# Patient Record
Sex: Female | Born: 1970
Health system: Southern US, Community
[De-identification: ages and names within clinical notes are randomized; demographics above are authoritative.]

## PROBLEM LIST (undated history)

## (undated) ENCOUNTER — Inpatient Hospital Stay (HOSPITAL_COMMUNITY): Payer: Self-pay

## (undated) DIAGNOSIS — Z8619 Personal history of other infectious and parasitic diseases: Secondary | ICD-10-CM

## (undated) DIAGNOSIS — R51 Headache: Secondary | ICD-10-CM

## (undated) DIAGNOSIS — O99810 Abnormal glucose complicating pregnancy: Secondary | ICD-10-CM

## (undated) DIAGNOSIS — D219 Benign neoplasm of connective and other soft tissue, unspecified: Secondary | ICD-10-CM

## (undated) DIAGNOSIS — Z6791 Unspecified blood type, Rh negative: Secondary | ICD-10-CM

## (undated) DIAGNOSIS — J302 Other seasonal allergic rhinitis: Secondary | ICD-10-CM

## (undated) DIAGNOSIS — D869 Sarcoidosis, unspecified: Secondary | ICD-10-CM

## (undated) DIAGNOSIS — I10 Essential (primary) hypertension: Secondary | ICD-10-CM

## (undated) DIAGNOSIS — O26899 Other specified pregnancy related conditions, unspecified trimester: Secondary | ICD-10-CM

## (undated) HISTORY — PX: WISDOM TOOTH EXTRACTION: SHX21

## (undated) HISTORY — PX: OTHER SURGICAL HISTORY: SHX169

## (undated) HISTORY — PX: TUBAL LIGATION: SHX77

## (undated) HISTORY — DX: Personal history of other infectious and parasitic diseases: Z86.19

---

## 2000-06-25 ENCOUNTER — Other Ambulatory Visit: Admission: RE | Admit: 2000-06-25 | Discharge: 2000-06-25 | Payer: Self-pay | Admitting: General Surgery

## 2000-06-25 ENCOUNTER — Encounter (INDEPENDENT_AMBULATORY_CARE_PROVIDER_SITE_OTHER): Payer: Self-pay

## 2001-08-12 ENCOUNTER — Other Ambulatory Visit: Admission: RE | Admit: 2001-08-12 | Discharge: 2001-08-12 | Payer: Self-pay | Admitting: Family Medicine

## 2004-01-23 ENCOUNTER — Other Ambulatory Visit: Admission: RE | Admit: 2004-01-23 | Discharge: 2004-01-23 | Payer: Self-pay | Admitting: Family Medicine

## 2004-07-11 ENCOUNTER — Inpatient Hospital Stay (HOSPITAL_COMMUNITY): Admission: AD | Admit: 2004-07-11 | Discharge: 2004-07-11 | Payer: Self-pay | Admitting: Obstetrics and Gynecology

## 2004-12-30 ENCOUNTER — Ambulatory Visit (HOSPITAL_COMMUNITY): Admission: RE | Admit: 2004-12-30 | Discharge: 2004-12-30 | Payer: Self-pay | Admitting: Obstetrics and Gynecology

## 2006-06-07 ENCOUNTER — Encounter: Admission: RE | Admit: 2006-06-07 | Discharge: 2006-06-07 | Payer: Self-pay | Admitting: Obstetrics and Gynecology

## 2006-07-27 ENCOUNTER — Inpatient Hospital Stay (HOSPITAL_COMMUNITY): Admission: AD | Admit: 2006-07-27 | Discharge: 2006-07-31 | Payer: Self-pay | Admitting: Obstetrics and Gynecology

## 2006-07-28 ENCOUNTER — Encounter (INDEPENDENT_AMBULATORY_CARE_PROVIDER_SITE_OTHER): Payer: Self-pay | Admitting: Specialist

## 2006-08-01 ENCOUNTER — Encounter: Admission: RE | Admit: 2006-08-01 | Discharge: 2006-08-31 | Payer: Self-pay | Admitting: Obstetrics and Gynecology

## 2006-09-01 ENCOUNTER — Encounter: Admission: RE | Admit: 2006-09-01 | Discharge: 2006-09-27 | Payer: Self-pay | Admitting: Obstetrics and Gynecology

## 2010-04-21 ENCOUNTER — Emergency Department (HOSPITAL_COMMUNITY)
Admission: EM | Admit: 2010-04-21 | Discharge: 2010-04-21 | Payer: Self-pay | Source: Home / Self Care | Admitting: Emergency Medicine

## 2010-10-03 NOTE — Op Note (Signed)
NAMEPATTYE, MEDA          ACCOUNT NO.:  [id number]   MEDICAL RECORD NO.:  [id number]          PATIENT TYPE:  INP   LOCATION:  9107                          FACILITY:  [facility]   PHYSICIAN:  Maxie Better, M.D.DATE OF BIRTH:  [birth year]   DATE OF PROCEDURE:  07/28/2006  DATE OF DISCHARGE:                               OPERATIVE REPORT   PREOPERATIVE DIAGNOSIS:  Nonreassuring fetal tracing, intrauterine  gestation of 40-1/7 weeks, oligohydramnios.   PROCEDURES:  Urgent primary cesarean section, Kerr hysterotomy.   POSTOPERATIVE DIAGNOSIS:  Nonreassuring fetal tracing, intrauterine  gestation at 40-1/7 weeks, oligohydramnios.   ANESTHESIA:  Spinal.   SURGEON:  Maxie Better, M.D.   ASSISTANT:  Marlinda Mike CNM.   INDICATION:  This is an 40 year old, G2, P0-0-1-0, married black female  at 40-1/7 weeks admitted for induction of labor secondary to  oligohydramnios and ultrasound for post dates.  The patient denied any  leakage of fluid.  She presented to [facility] where ultimately a  reactive nonstress test was noted.  The patient had Cervidil placement.  Subsequently, the patient was noted to have spontaneous decelerations  lasting for 2-1/2 to 3 minutes duration, down to 120s for a baseline of  140.  The Cervidil was removed.  The cervix was noted to still be  closed, the presenting part of the pelvis.  Due to the inability to the  abscess the fetal well-being, cesarean section was recommended.  The  patient was apprised of the risks, transferred to the OR for the  procedure.   PROCEDURE:  Under adequate spinal anesthesia, the patient was placed in  the supine position with a left lateral tilt.  She was sterilely prepped  and draped in the usual fashion.  Indwelling Foley catheter was  sterilely placed.  A Pfannenstiel skin incision was then made, carried  down to the rectus fascia.  Rectus fascia was opened transversely.  Rectus fascia then bluntly and  sharply dissected off the rectus muscle  in a superior and inferior fashion.  The rectus muscle was split in the  midline.  The parietal peritoneum was opened bluntly.  The vesicouterine  peritoneum was opened transversely.  The bladder was bluntly dissected  off the lower uterine segment and displaced inferiorly.  A curvilinear  low transverse uterine incision was then made and extended bilaterally  using bandage scissors.  On entering the uterine cavity, thick meconium  was noted.  Subsequent delivery of a live female, cord around the neck  which was reducible, was delivered.  Baby was bulb suctioned.  Cord was  clamped, cut.  The baby was transferred to the awaiting pediatrician who  assigned Apgars of 8 and 9 at one and five minutes.  Cord pH was  obtained which was ultimately noted to be 7.31.  The placenta was  spontaneous, intact, was noted to be bilobed with a velamentous cord  insertion that was sent to pathology.  Uterine cavity was cleaned of  debris.  Uterine incision had no extension.  Uterine incision was closed  in two layers, the first layer 0 Monocryl running locked stitch, second  layer imbricated 0 Monocryl  suture.  Good hemostasis was noted.  Abdomen  was copiously irrigated and suctioned of debris.  Normal tubes and  ovaries were noted bilaterally.  The vesicouterine peritoneum and the  parietal peritoneum were not closed.  Rectus fascia was closed with 0  Vicryl x2.  The subcutaneous areas were irrigated, small bleeders  cauterized.  Interrupted 2-0 plain sutures were placed to approximate  the subcutaneous area and the skin approximated with  Ethicon staples.  The placenta sent to pathology.  Estimated blood loss  was 800 mL.  Intraoperative fluid was 2000.  Urine output was 500 mL.  Sponge and instrument counts x2 were correct.  Complication was none.  Weight of the baby was 7 pounds.  The patient was transferred to  recovery room in stable condition.       Maxie Better, M.D.  Electronically Signed     [place]/MEDQ  D:  07/28/2006  T:  07/29/2006  Job:  [id number]

## 2010-10-03 NOTE — Discharge Summary (Signed)
Brenda Valencia, Brenda Valencia          ACCOUNT NO.:  [id number]   MEDICAL RECORD NO.:  [id number]          PATIENT TYPE:  INP   LOCATION:  9107                          [facility]ACILITY:  [facility]   PHYSICIAN:  Maxie Better, M.D.DATE OF BIRTH:  1971-02-22   DATE OF ADMISSION:  07/27/2006  DATE OF DISCHARGE:  07/31/2006                               DISCHARGE SUMMARY   ADMISSION DIAGNOSES:  1. Intrauterine gestation at 52 and 1/7 weeks.  2. Oligohydramnios.   DISCHARGE DIAGNOSES:  1. Intrauterine gestation at 80 and 1/7 weeks, delivered.  2. Nonreassuring fetal tracing.  3. Oligohydramnios.   PROCEDURE:  Primary Cesarean section Kerr hysterotomy.   HISTORY OF PRESENT ILLNESS:  40 year old g2, p0-0-1-0 female at 61 and  1/7 weeks, admitted for induction and labor secondary to  oligohydramnios.   HOSPITAL COURSE:  The patient was admitted.  Blood pressure was 143/94.  She had 1+ edema.  She had a reactive tracing.  The cervix was closed  50%.  The patient had Cervidil placement.  She subsequently had  recurrent decelerations for 2 to 2.5 minutes with the cervix remaining  closed.  Fetal heart rate was in the 160's with spontaneous  decelerations down to the 120's.  The Cervidil was removed and the  cervix was not open.  Given the ability to assess this fetus, a primary  Cesarean section was recommended for nonreassuring tracing.  The patient  was taken to the operating room.  A primary Cesarean section was  performed.  Live female, cord around the neck x1, Apgars 8 and 9, cord pH  of 7.31, normal tubes and ovaries, thick meconium and a bilobed  velamentous cord insertion of the placenta was noted.  The placenta was  sent to pathology.  The pathologist confirmed a bilobed placenta with a  marginal velamentous insertion noted and confirmed as well.  The patient  had an uncomplicated postoperative course.  Her CBC on postoperative day  number 1 showed a hemoglobin of 9.0, a hematocrit of 27.5, a  white count  of 9.8 and a platelet count of 145,000.  By postoperative day number 3,  the patient passed flatus, was tolerating a regular diet with a blood  pressure of 131/84.  Her incision had no erythema, induration or  exudate.  She was deemed well to be discharged home.   DISPOSITION:  Home.   CONDITION:  Stable.   DISCHARGE MEDICATIONS:  1. Motrin 600 milligrams every 6 hours p.r.n. pain.  2. Iron supplementation 1 p.o. q. day.  3. Prenatal vitamins 1 p.o. q. day.  4. Tylenol 1-2 tablets every 3-4 hours p.r.n. pain.   FOLLOW-UP:  Follow-up appointment with [facility] in 6 weeks.   DISCHARGE INSTRUCTIONS:  Per the postpartum booklet given.      Maxie Better, M.D.  Electronically Signed     [place]/MEDQ  D:  09/12/2006  T:  09/12/2006  Job:  [id number]

## 2011-02-23 ENCOUNTER — Inpatient Hospital Stay (HOSPITAL_COMMUNITY)
Admission: AD | Admit: 2011-02-23 | Discharge: 2011-02-23 | Disposition: A | Discharge status: Home / Self Care | Payer: Self-pay | Source: Ambulatory Visit | Attending: Obstetrics & Gynecology | Admitting: Obstetrics & Gynecology

## 2011-02-23 ENCOUNTER — Encounter (HOSPITAL_COMMUNITY): Payer: Self-pay

## 2011-02-23 ENCOUNTER — Inpatient Hospital Stay (HOSPITAL_COMMUNITY): Payer: Self-pay

## 2011-02-23 DIAGNOSIS — O26859 Spotting complicating pregnancy, unspecified trimester: Secondary | ICD-10-CM | POA: Diagnosis present

## 2011-02-23 DIAGNOSIS — N760 Acute vaginitis: Secondary | ICD-10-CM | POA: Insufficient documentation

## 2011-02-23 DIAGNOSIS — A499 Bacterial infection, unspecified: Secondary | ICD-10-CM | POA: Insufficient documentation

## 2011-02-23 DIAGNOSIS — B9689 Other specified bacterial agents as the cause of diseases classified elsewhere: Secondary | ICD-10-CM | POA: Insufficient documentation

## 2011-02-23 DIAGNOSIS — O2390 Unspecified genitourinary tract infection in pregnancy, unspecified trimester: Secondary | ICD-10-CM | POA: Insufficient documentation

## 2011-02-23 LAB — URINALYSIS, ROUTINE W REFLEX MICROSCOPIC
Bilirubin Urine: NEGATIVE
Glucose, UA: NEGATIVE mg/dL
Hgb urine dipstick: NEGATIVE
Specific Gravity, Urine: <1.005 — ABNORMAL LOW (ref 1.005–1.030)
Urobilinogen, UA: 0.2 mg/dL (ref 0.0–1.0)
pH: 6 (ref 5.0–8.0)

## 2011-02-23 LAB — WET PREP, GENITAL
Trich, Wet Prep: NONE SEEN
Yeast Wet Prep HPF POC: NONE SEEN

## 2011-02-23 LAB — CBC
Hemoglobin: 11.9 g/dL — ABNORMAL LOW (ref 12.0–15.0)
MCH: 27.8 pg (ref 26.0–34.0)
RBC: 4.28 MIL/uL (ref 3.87–5.11)

## 2011-02-23 LAB — HCG, QUANTITATIVE, PREGNANCY: hCG, Beta Chain, Quant, S: 2534 m[IU]/mL — ABNORMAL HIGH (ref <5)

## 2011-02-23 MED ORDER — METRONIDAZOLE 500 MG PO TABS: 500.0000 mg | ORAL_TABLET | Freq: Two times a day (BID) | ORAL | Status: AC

## 2011-02-23 NOTE — Progress Notes (Signed)
Pt states took upt at home Saturday, was +, then started spotting. Prior tubal pregnancy on left side. Having intermittent abd pain on left side, none present now. Spotting intermittently, none this am.

## 2011-02-23 NOTE — ED Provider Notes (Signed)
History     Chief Complaint  Patient presents with  . Vaginal Bleeding   HPI + UPT on Saturday, intermittent spotting since then, occasional left sided pain, none now. Unsure LMP, ? 01/10/11. History of Left ectopic.   OB History    Grav Para Term Preterm Abortions TAB SAB Ect Mult Living   3 1 1  1   1  1       Past Medical History  Diagnosis Date  . Ectopic pregnancy     Treated with MTX    Past Surgical History  Procedure Date  . Cesarean section   . Wisdom tooth extraction     History reviewed. No pertinent family history.  History  Substance Use Topics  . Smoking status: Never Smoker   . Smokeless tobacco: Never Used  . Alcohol Use: No    Allergies:  Allergies  Allergen Reactions  . Penicillins Hives and Nausea And Vomiting    No prescriptions prior to admission    Review of Systems  Constitutional: Negative.   Respiratory: Negative.   Cardiovascular: Negative.   Gastrointestinal: Positive for abdominal pain. Negative for nausea, vomiting, diarrhea and constipation.  Genitourinary: Negative for dysuria, urgency, frequency, hematuria and flank pain.       Positive for vaginal bleeding  Musculoskeletal: Negative.   Neurological: Negative.   Psychiatric/Behavioral: Negative.    Physical Exam   Blood pressure 131/90, pulse 101, temperature 98.6 F (37 C), temperature source Oral, resp. rate 16, height 5\' 6"  (1.676 m), weight 107.673 kg (237 lb 6 oz), last menstrual period 01/06/2011.  Physical Exam  Nursing note and vitals reviewed. Constitutional: She is oriented to person, place, and time. She appears well-developed and well-nourished. No distress.  HENT:  Head: Normocephalic and atraumatic.  Cardiovascular: Normal rate.   Respiratory: Effort normal.  GI: Soft. Bowel sounds are normal. She exhibits no mass. There is no tenderness. There is no rebound and no guarding.  Genitourinary: There is no rash or lesion on the right labia. There is no rash  or lesion on the left labia. Uterus is not tender. Enlarged: Size c/w dates. Cervix exhibits no motion tenderness, no discharge and no friability. Right adnexum displays no mass, no tenderness and no fullness. Left adnexum displays no mass, no tenderness and no fullness. No tenderness or bleeding around the vagina. No vaginal discharge found.  Musculoskeletal: Normal range of motion.  Neurological: She is alert and oriented to person, place, and time.  Skin: Skin is warm and dry.  Psychiatric: She has a normal mood and affect.    MAU Course  Procedures  Results for orders placed during the hospital encounter of 02/23/11 (from the past 24 hour(s))  URINALYSIS, ROUTINE W REFLEX MICROSCOPIC     Status: Abnormal   Collection Time   02/23/11  8:47 AM      Component Value Range   Color, Urine YELLOW  YELLOW    Appearance CLEAR  CLEAR    Specific Gravity, Urine <1.005 (*) 1.005 - 1.030    pH 6.0  5.0 - 8.0    Glucose, UA NEGATIVE  NEGATIVE (mg/dL)   Hgb urine dipstick NEGATIVE  NEGATIVE    Bilirubin Urine NEGATIVE  NEGATIVE    Ketones, ur NEGATIVE  NEGATIVE (mg/dL)   Protein, ur NEGATIVE  NEGATIVE (mg/dL)   Urobilinogen, UA 0.2  0.0 - 1.0 (mg/dL)   Nitrite NEGATIVE  NEGATIVE    Leukocytes, UA NEGATIVE  NEGATIVE   CBC  Status: Abnormal   Collection Time   02/23/11  9:40 AM      Component Value Range   WBC 7.2  4.0 - 10.5 (K/uL)   RBC 4.28  3.87 - 5.11 (MIL/uL)   Hemoglobin 11.9 (*) 12.0 - 15.0 (g/dL)   HCT 27.2  53.6 - 64.4 (%)   MCV 85.5  78.0 - 100.0 (fL)   MCH 27.8  26.0 - 34.0 (pg)   MCHC 32.5  30.0 - 36.0 (g/dL)   RDW 03.4  74.2 - 59.5 (%)   Platelets 262  150 - 400 (K/uL)  ABO/RH     Status: Normal   Collection Time   02/23/11  9:40 AM      Component Value Range   ABO/RH(D) B NEG    HCG, QUANTITATIVE, PREGNANCY     Status: Abnormal   Collection Time   02/23/11  9:40 AM      Component Value Range   hCG, Beta Chain, Quant, S 2534 (*) <5 (mIU/mL)  WET PREP, GENITAL      Status: Abnormal   Collection Time   02/23/11 10:50 AM      Component Value Range   Yeast, Wet Prep NONE SEEN  NONE SEEN    Trich, Wet Prep NONE SEEN  NONE SEEN    Clue Cells, Wet Prep MODERATE (*) NONE SEEN    WBC, Wet Prep HPF POC FEW (*) NONE SEEN    U/S - IUGS, no yolk sac, no adnexal mass Assessment and Plan  40 y.o. [id number] with spotting in early pregnancy Bacterial vaginosis - rx flagyl - did not inform patient prior to discharge, will send rx and inform her on Wednesday at repeat visit Return in 2 days for repeat quant Ectopic precautions rev'd Amiee Wiley 02/23/2011, 11:45 AM

## 2011-02-24 LAB — GC/CHLAMYDIA PROBE AMP, GENITAL
Chlamydia, DNA Probe: NEGATIVE
GC Probe Amp, Genital: NEGATIVE

## 2011-02-25 ENCOUNTER — Inpatient Hospital Stay (HOSPITAL_COMMUNITY)
Admission: AD | Admit: 2011-02-25 | Discharge: 2011-02-25 | Disposition: A | Discharge status: Home / Self Care | Payer: Self-pay | Source: Ambulatory Visit | Attending: Family Medicine | Admitting: Family Medicine

## 2011-02-25 DIAGNOSIS — O2690 Pregnancy related conditions, unspecified, unspecified trimester: Secondary | ICD-10-CM

## 2011-02-25 DIAGNOSIS — O99891 Other specified diseases and conditions complicating pregnancy: Secondary | ICD-10-CM | POA: Insufficient documentation

## 2011-02-25 LAB — HCG, QUANTITATIVE, PREGNANCY: hCG, Beta Chain, Quant, S: 4352 m[IU]/mL — ABNORMAL HIGH (ref <5)

## 2011-02-25 NOTE — ED Provider Notes (Signed)
Brenda Valencia is a 40 y.o. female who presents to MAU for follow up Bhcg. Her initial visit was 02/23/11 and at that time the Bhcg was 2,534 and on ultrasound a 5 week IUGS was visualized but no YS or FP identified. Her blood type is B negative. She denies bleeding or pain today. The Bhcg today has increased to 4,352. The patient is to return in one week for follow up ultrasound. She will continue ectopic precautions until IUP identified. She will return immediately for any problems.  [place], [place] 02/25/11 [phone]

## 2011-02-25 NOTE — Progress Notes (Signed)
Pt to MAU for follow up BHCG. Pt denies any pain or bleeding.  

## 2011-03-05 ENCOUNTER — Encounter (HOSPITAL_COMMUNITY): Payer: Self-pay | Admitting: *Deleted

## 2011-03-05 ENCOUNTER — Inpatient Hospital Stay (HOSPITAL_COMMUNITY): Payer: Self-pay

## 2011-03-05 ENCOUNTER — Inpatient Hospital Stay (HOSPITAL_COMMUNITY)
Admission: AD | Admit: 2011-03-05 | Discharge: 2011-03-05 | Disposition: A | Discharge status: Home / Self Care | Payer: Self-pay | Source: Ambulatory Visit | Attending: Family Medicine | Admitting: Family Medicine

## 2011-03-05 DIAGNOSIS — Z1389 Encounter for screening for other disorder: Secondary | ICD-10-CM

## 2011-03-05 DIAGNOSIS — O99891 Other specified diseases and conditions complicating pregnancy: Secondary | ICD-10-CM | POA: Insufficient documentation

## 2011-03-05 DIAGNOSIS — Z3490 Encounter for supervision of normal pregnancy, unspecified, unspecified trimester: Secondary | ICD-10-CM

## 2011-03-05 HISTORY — DX: Headache: R51

## 2011-03-05 NOTE — ED Notes (Deleted)
C/o cramping for past 2 weeks; desires an u/s; moving to [place] in a couple of days; No prenatal care; G4P3;

## 2011-03-05 NOTE — ED Provider Notes (Signed)
Chart reviewed and agree with management and plan.  

## 2011-03-05 NOTE — ED Provider Notes (Signed)
History   Pt presents today for viability [place]. She states she is doing well and has no complaints at this time.  No chief complaint on file.  HPI  OB History    Grav Para Term Preterm Abortions TAB SAB Ect Mult Living   4 3 3   0     3      Past Medical History  Diagnosis Date  . Abnormal Pap smear   . HPV (human papilloma virus) infection   . UTI (lower urinary tract infection)     Past Surgical History  Procedure Date  . Wisdom tooth extraction   . Cholecystectomy     No family history on file.  History  Substance Use Topics  . Smoking status: Never Smoker   . Smokeless tobacco: Never Used  . Alcohol Use: No    Allergies:  Allergies  Allergen Reactions  . Penicillins Hives and Nausea And Vomiting    Prescriptions prior to admission  Medication Sig Dispense Refill  . acetaminophen (TYLENOL) 500 MG tablet Take 500 mg by mouth every 6 (six) hours as needed. For headache.       . Diphenhydramine-PSE-APAP (BENADRYL ALLERGY/SINUS/HEADACH PO) Take 1 tablet by mouth daily as needed. For headache.       . metroNIDAZOLE (FLAGYL) 500 MG tablet Take 1 tablet (500 mg total) by mouth 2 (two) times daily.  14 tablet  0  . Multiple Vitamins-Minerals (MULTIVITAMIN WITH MINERALS) tablet Take 1 tablet by mouth daily.        . Thiamine HCl (VITAMIN B-1) 50 MG tablet Take 50 mg by mouth daily.          Review of Systems  Constitutional: Negative for fever.  Cardiovascular: Negative for chest pain.  Gastrointestinal: Negative for nausea, vomiting, abdominal pain, diarrhea and constipation.  Genitourinary: Negative for dysuria, urgency, frequency and hematuria.  Neurological: Negative for dizziness and headaches.  Psychiatric/Behavioral: Negative for depression and suicidal ideas.   Physical Exam   Blood pressure 112/63, pulse 70, temperature 98 F (36.7 C), temperature source Oral, resp. rate 18, height 5\' 3"  (1.6 m), weight 170 lb (77.111 kg), last menstrual period  01/06/2011.  Physical Exam  Nursing note and vitals reviewed. Constitutional: She is oriented to person, place, and time. She appears well-developed and well-nourished. No distress.  HENT:  Head: Normocephalic and atraumatic.  Eyes: EOM are normal. Pupils are equal, round, and reactive to light.  GI: Soft. She exhibits no distension. There is no tenderness. There is no rebound and no guarding.  Neurological: She is alert and oriented to person, place, and time.  Skin: Skin is warm and dry. She is not diaphoretic.  Psychiatric: She has a normal mood and affect. Her behavior is normal. Judgment and thought content normal.    MAU Course  Procedures  US shows single IUP at 6.2wks with cardiac activity. EDC of 10/27/11.  Assessment and Plan  IUP: discussed with pt at length. She will begin prenatal care. Discussed diet, activity, risks, and precautions.  Clinton Gallant. Rice III, DrHSc, MPAS, PA-C  03/05/2011, 9:02 AM   Henrietta Hoover, PA 03/05/11 [phone]

## 2011-03-05 NOTE — Progress Notes (Signed)
Follow up US for viability.  Denies pain or bleeding.

## 2011-03-19 ENCOUNTER — Inpatient Hospital Stay (HOSPITAL_COMMUNITY)
Admission: AD | Admit: 2011-03-19 | Discharge: 2011-03-19 | Disposition: A | Discharge status: Home / Self Care | Payer: Self-pay | Source: Ambulatory Visit | Attending: Obstetrics & Gynecology | Admitting: Obstetrics & Gynecology

## 2011-03-19 ENCOUNTER — Encounter (HOSPITAL_COMMUNITY): Payer: Self-pay | Admitting: *Deleted

## 2011-03-19 DIAGNOSIS — Z8759 Personal history of other complications of pregnancy, childbirth and the puerperium: Secondary | ICD-10-CM | POA: Insufficient documentation

## 2011-03-19 DIAGNOSIS — Z98891 History of uterine scar from previous surgery: Secondary | ICD-10-CM | POA: Insufficient documentation

## 2011-03-19 DIAGNOSIS — O209 Hemorrhage in early pregnancy, unspecified: Secondary | ICD-10-CM | POA: Insufficient documentation

## 2011-03-19 DIAGNOSIS — D219 Benign neoplasm of connective and other soft tissue, unspecified: Secondary | ICD-10-CM | POA: Insufficient documentation

## 2011-03-19 DIAGNOSIS — R1032 Left lower quadrant pain: Secondary | ICD-10-CM | POA: Insufficient documentation

## 2011-03-19 DIAGNOSIS — O09529 Supervision of elderly multigravida, unspecified trimester: Secondary | ICD-10-CM | POA: Insufficient documentation

## 2011-03-19 DIAGNOSIS — D259 Leiomyoma of uterus, unspecified: Secondary | ICD-10-CM | POA: Insufficient documentation

## 2011-03-19 DIAGNOSIS — O26859 Spotting complicating pregnancy, unspecified trimester: Secondary | ICD-10-CM

## 2011-03-19 DIAGNOSIS — Z880 Allergy status to penicillin: Secondary | ICD-10-CM | POA: Insufficient documentation

## 2011-03-19 HISTORY — DX: Benign neoplasm of connective and other soft tissue, unspecified: D21.9

## 2011-03-19 MED ORDER — RHO D IMMUNE GLOBULIN 1500 UNIT/2ML IJ SOLN
300.0000 ug | Freq: Once | INTRAMUSCULAR | Status: AC
  Administered 2011-03-19: 300 ug via INTRAMUSCULAR
  Filled 2011-03-19: qty 2

## 2011-03-19 NOTE — ED Notes (Signed)
Rh factor and rhophyllac hand out given and reviewed with pt. Has appt as [facility] today.

## 2011-03-19 NOTE — ED Notes (Signed)
Bedside US- by PA.  +FH noted

## 2011-03-19 NOTE — Progress Notes (Signed)
Sharp pain in LLQ woke pt up this morning.  Last night noted dark brown d/c- noted x1

## 2011-03-19 NOTE — ED Notes (Signed)
Pt to lobby waiting on injection

## 2011-03-19 NOTE — ED Provider Notes (Signed)
History   Pt presents today c/o LLQ pain that woke her from sleep this am. She also noted some dark vag dc yesterday that looked like "old blood." She denies any bleeding or dc at this time. Her last episode of intercourse was several days ago. She denies fever, vag irritation, or any other sx at this time. She has an appt with [facility] later today to establish prenatal care.  Chief Complaint  Patient presents with  . Vaginal Discharge   HPI  OB History    Grav Para Term Preterm Abortions TAB SAB Ect Mult Living   3 1 1  1   1  1       Past Medical History  Diagnosis Date  . Ectopic pregnancy     Treated with MTX  . Headache   . Fibroids     Past Surgical History  Procedure Date  . Cesarean section     No family history on file.  History  Substance Use Topics  . Smoking status: Never Smoker   . Smokeless tobacco: Never Used  . Alcohol Use: No    Allergies:  Allergies  Allergen Reactions  . Penicillins Hives and Nausea And Vomiting    Prescriptions prior to admission  Medication Sig Dispense Refill  . acetaminophen (TYLENOL) 500 MG tablet Take 500 mg by mouth every 6 (six) hours as needed. For headache.       . Diphenhydramine-PSE-APAP (BENADRYL ALLERGY/SINUS/HEADACH PO) Take 1 tablet by mouth daily as needed. For headache.       . Multiple Vitamins-Minerals (MULTIVITAMIN WITH MINERALS) tablet Take 1 tablet by mouth daily.        . Thiamine HCl (VITAMIN B-1) 50 MG tablet Take 50 mg by mouth daily.          Review of Systems  Constitutional: Negative for fever.  Cardiovascular: Negative for chest pain.  Gastrointestinal: Positive for abdominal pain. Negative for nausea, vomiting, diarrhea and constipation.  Genitourinary: Negative for dysuria, urgency, frequency and hematuria.  Neurological: Negative for dizziness and headaches.  Psychiatric/Behavioral: Negative for depression and suicidal ideas.   Physical Exam   Blood pressure 151/91, pulse 102, temperature  98.8 F (37.1 C), temperature source Oral, resp. rate 20, height 5\' 6"  (1.676 m), weight 231 lb 12.8 oz (105.144 kg).  Physical Exam  Nursing note and vitals reviewed. Constitutional: She is oriented to person, place, and time. She appears well-developed and well-nourished. No distress.  HENT:  Head: Normocephalic and atraumatic.  Eyes: EOM are normal. Pupils are equal, round, and reactive to light.  GI: Soft. She exhibits no distension. There is no tenderness. There is no rebound and no guarding.  Genitourinary: No bleeding around the vagina. No vaginal discharge found.       Cervix Lg/closed/firm. No dc or bleeding.  Neurological: She is alert and oriented to person, place, and time.  Skin: Skin is warm and dry. She is not diaphoretic.  Psychiatric: She has a normal mood and affect. Her behavior is normal. Judgment and thought content normal.    MAU Course  Procedures  Bedside US shows single IUP with good cardiac activity.  Blood type B neg. Pt will be given rhophylac.  Assessment and Plan  Bleeding in preg: discussed with pt at length. Probably a result from recent intercourse/fibroids. Pt will keep her appt today with [facility]. Discussed diet, activity, risks, and precautions.  Clinton Gallant. Rice III, DrHSc, MPAS, PA-C  03/19/2011, 8:59 AM   Henrietta Hoover, PA 03/19/11 [phone]

## 2011-03-19 NOTE — ED Notes (Signed)
Discussed BP with PA.

## 2011-03-20 LAB — RH IG WORKUP (INCLUDES ABO/RH)
ABO/RH(D): B NEG
Gestational Age(Wks): 8.3

## 2011-06-22 NOTE — ED Provider Notes (Signed)
Chart reviewed and agree with management and plan.  

## 2011-07-31 ENCOUNTER — Encounter: Payer: Self-pay | Admitting: Obstetrics and Gynecology

## 2011-08-10 ENCOUNTER — Other Ambulatory Visit: Payer: Self-pay | Admitting: Obstetrics and Gynecology

## 2011-08-10 ENCOUNTER — Encounter (INDEPENDENT_AMBULATORY_CARE_PROVIDER_SITE_OTHER): Payer: Medicaid Other | Admitting: Obstetrics and Gynecology

## 2011-08-10 DIAGNOSIS — Z3480 Encounter for supervision of other normal pregnancy, unspecified trimester: Secondary | ICD-10-CM

## 2011-08-12 ENCOUNTER — Other Ambulatory Visit: Payer: Self-pay | Admitting: Obstetrics and Gynecology

## 2011-08-20 ENCOUNTER — Inpatient Hospital Stay (HOSPITAL_COMMUNITY)
Admission: AD | Admit: 2011-08-20 | Discharge: 2011-08-20 | Disposition: A | Discharge status: Home / Self Care | Payer: Medicaid Other | Source: Ambulatory Visit | Attending: Obstetrics and Gynecology | Admitting: Obstetrics and Gynecology

## 2011-08-20 DIAGNOSIS — Z008 Encounter for other general examination: Secondary | ICD-10-CM | POA: Insufficient documentation

## 2011-08-20 MED ORDER — RHO D IMMUNE GLOBULIN 1500 UNIT/2ML IJ SOLN
300.0000 ug | Freq: Once | INTRAMUSCULAR | Status: AC
  Administered 2011-08-20: 300 ug via INTRAMUSCULAR
  Filled 2011-08-20: qty 2

## 2011-08-20 NOTE — Plan of Care (Signed)
Rhophylac information sheet given to the patient waiting in the lobby. Explained the process of 1 1/2 hours required to prepare and administer medication. Patient may leave after blood is drawn. Instructed not to remove the bracelet. Patient understands.

## 2011-08-21 LAB — RH IG WORKUP (INCLUDES ABO/RH)
ABO/RH(D): B NEG
Antibody Screen: NEGATIVE
Unit division: 0

## 2011-08-24 ENCOUNTER — Encounter (INDEPENDENT_AMBULATORY_CARE_PROVIDER_SITE_OTHER): Payer: Medicaid Other | Admitting: Obstetrics and Gynecology

## 2011-08-24 ENCOUNTER — Other Ambulatory Visit (INDEPENDENT_AMBULATORY_CARE_PROVIDER_SITE_OTHER): Payer: Medicaid Other

## 2011-08-24 DIAGNOSIS — O09529 Supervision of elderly multigravida, unspecified trimester: Secondary | ICD-10-CM

## 2011-08-24 DIAGNOSIS — O34219 Maternal care for unspecified type scar from previous cesarean delivery: Secondary | ICD-10-CM

## 2011-08-24 DIAGNOSIS — Z331 Pregnant state, incidental: Secondary | ICD-10-CM

## 2011-08-28 ENCOUNTER — Other Ambulatory Visit: Payer: Self-pay | Admitting: Obstetrics and Gynecology

## 2011-08-29 LAB — GLUCOSE TOLERANCE, 3 HOURS: Glucose Tolerance, 1 hour: 144 mg/dL (ref 70–189)

## 2011-09-09 DIAGNOSIS — Z8759 Personal history of other complications of pregnancy, childbirth and the puerperium: Secondary | ICD-10-CM

## 2011-09-09 DIAGNOSIS — Z98891 History of uterine scar from previous surgery: Secondary | ICD-10-CM

## 2011-09-09 DIAGNOSIS — D219 Benign neoplasm of connective and other soft tissue, unspecified: Secondary | ICD-10-CM

## 2011-09-09 DIAGNOSIS — O09529 Supervision of elderly multigravida, unspecified trimester: Secondary | ICD-10-CM

## 2011-09-09 DIAGNOSIS — Z880 Allergy status to penicillin: Secondary | ICD-10-CM

## 2011-09-10 ENCOUNTER — Ambulatory Visit (INDEPENDENT_AMBULATORY_CARE_PROVIDER_SITE_OTHER): Payer: Medicaid Other | Admitting: Obstetrics and Gynecology

## 2011-09-10 ENCOUNTER — Encounter: Payer: Self-pay | Admitting: Obstetrics and Gynecology

## 2011-09-10 VITALS — BP 112/70 | Wt 230.0 lb

## 2011-09-10 DIAGNOSIS — Z98891 History of uterine scar from previous surgery: Secondary | ICD-10-CM

## 2011-09-10 DIAGNOSIS — Z9889 Other specified postprocedural states: Secondary | ICD-10-CM

## 2011-09-10 NOTE — Progress Notes (Signed)
No complaints except mild irritation at panty line in groin improving with washing and drying well Pt declined exam, says it's improving RTO 2wks 3hr GTT wnl S/p Rhogam a couple of wks ago

## 2011-09-24 ENCOUNTER — Ambulatory Visit (INDEPENDENT_AMBULATORY_CARE_PROVIDER_SITE_OTHER): Payer: Medicaid Other | Admitting: Obstetrics and Gynecology

## 2011-09-24 ENCOUNTER — Encounter: Payer: Self-pay | Admitting: Obstetrics and Gynecology

## 2011-09-24 VITALS — BP 112/62 | Wt 234.0 lb

## 2011-09-24 DIAGNOSIS — Z3480 Encounter for supervision of other normal pregnancy, unspecified trimester: Secondary | ICD-10-CM

## 2011-09-24 DIAGNOSIS — O3660X0 Maternal care for excessive fetal growth, unspecified trimester, not applicable or unspecified: Secondary | ICD-10-CM

## 2011-09-24 DIAGNOSIS — IMO0002 Reserved for concepts with insufficient information to code with codable children: Secondary | ICD-10-CM

## 2011-09-24 DIAGNOSIS — N898 Other specified noninflammatory disorders of vagina: Secondary | ICD-10-CM

## 2011-09-24 LAB — POCT WET PREP (WET MOUNT): Clue Cells Wet Prep Whiff POC: NEGATIVE

## 2011-09-24 MED ORDER — CLOTRIMAZOLE-BETAMETHASONE 1-0.05 % EX CREA: TOPICAL_CREAM | Freq: Every day | CUTANEOUS | Status: DC

## 2011-09-24 NOTE — Progress Notes (Signed)
Pt request VE. Pt complains of pain in RLQ pain. Also c/o of chaffing in vaginal area. Pt unable to use restroom. Scheduled for c-section on 10/19/2011

## 2011-09-24 NOTE — Progress Notes (Signed)
Still c/o chaffing.  Also c/o odor. Wet prep LGA - u/s at [facility]  Results for orders placed in visit on 09/24/11  POCT WET PREP (WET MOUNT)      Component Value Range   Source Wet Prep POC vaginal     WBC, Wet Prep HPF POC       Bacteria Wet Prep HPF POC no     BACTERIA WET PREP MORPHOLOGY POC       Clue Cells Wet Prep HPF POC None     CLUE CELLS WET PREP WHIFF POC Negative Whiff     Yeast Wet Prep HPF POC None     KOH Wet Prep POC       Trichomonas Wet Prep HPF POC none     pH 5.0     RTO 1wk GBS at [facility]

## 2011-09-29 ENCOUNTER — Ambulatory Visit (INDEPENDENT_AMBULATORY_CARE_PROVIDER_SITE_OTHER): Payer: Medicaid Other

## 2011-09-29 ENCOUNTER — Ambulatory Visit (INDEPENDENT_AMBULATORY_CARE_PROVIDER_SITE_OTHER): Payer: Medicaid Other | Admitting: Obstetrics and Gynecology

## 2011-09-29 ENCOUNTER — Encounter: Payer: Self-pay | Admitting: Obstetrics and Gynecology

## 2011-09-29 VITALS — BP 116/70 | Wt 235.0 lb

## 2011-09-29 DIAGNOSIS — Z3685 Encounter for antenatal screening for Streptococcus B: Secondary | ICD-10-CM

## 2011-09-29 DIAGNOSIS — Z331 Pregnant state, incidental: Secondary | ICD-10-CM

## 2011-09-29 DIAGNOSIS — E669 Obesity, unspecified: Secondary | ICD-10-CM

## 2011-09-29 DIAGNOSIS — Z302 Encounter for sterilization: Secondary | ICD-10-CM | POA: Insufficient documentation

## 2011-09-29 DIAGNOSIS — O3660X0 Maternal care for excessive fetal growth, unspecified trimester, not applicable or unspecified: Secondary | ICD-10-CM

## 2011-09-29 DIAGNOSIS — O321XX0 Maternal care for breech presentation, not applicable or unspecified: Secondary | ICD-10-CM | POA: Insufficient documentation

## 2011-09-29 LAB — US OB FOLLOW UP

## 2011-09-29 NOTE — Progress Notes (Signed)
The patient is doing well.  She is looking for term cesarean section at 39 weeks.  Beta strep sent today. Ultrasound: Single gestation, breech, normal fluid, 61st percentile. Return office in 1 week. Dr. Stefano Gaul

## 2011-10-01 ENCOUNTER — Encounter (HOSPITAL_COMMUNITY): Payer: Self-pay | Admitting: Pharmacist

## 2011-10-02 LAB — CULTURE, BETA STREP (GROUP B ONLY)

## 2011-10-05 ENCOUNTER — Telehealth: Payer: Self-pay | Admitting: Obstetrics and Gynecology

## 2011-10-05 NOTE — Telephone Encounter (Signed)
Repeat LT C/S; Tubal scheduled for 10/19/11 @ 9:00 with SR -Adrianne Pridgen

## 2011-10-07 ENCOUNTER — Ambulatory Visit (INDEPENDENT_AMBULATORY_CARE_PROVIDER_SITE_OTHER): Payer: Medicaid Other | Admitting: Obstetrics and Gynecology

## 2011-10-07 VITALS — BP 110/64 | Wt 237.0 lb

## 2011-10-07 DIAGNOSIS — Z331 Pregnant state, incidental: Secondary | ICD-10-CM

## 2011-10-07 NOTE — Progress Notes (Signed)
Well. No complaints

## 2011-10-07 NOTE — Progress Notes (Signed)
Pt.stated baby does not seem to move as much.no other issues .

## 2011-10-13 ENCOUNTER — Encounter (HOSPITAL_COMMUNITY)
Admission: RE | Admit: 2011-10-13 | Discharge: 2011-10-13 | Disposition: A | Discharge status: Home / Self Care | Payer: Medicaid Other | Source: Ambulatory Visit | Attending: Obstetrics and Gynecology | Admitting: Obstetrics and Gynecology

## 2011-10-13 ENCOUNTER — Encounter (HOSPITAL_COMMUNITY): Payer: Self-pay

## 2011-10-13 VITALS — BP 137/80 | HR 102 | Ht 66.0 in | Wt 235.0 lb

## 2011-10-13 DIAGNOSIS — Z98891 History of uterine scar from previous surgery: Secondary | ICD-10-CM

## 2011-10-13 HISTORY — DX: Other seasonal allergic rhinitis: J30.2

## 2011-10-13 LAB — CBC
HCT: 34.5 % — ABNORMAL LOW (ref 36.0–46.0)
MCH: 27 pg (ref 26.0–34.0)
MCHC: 31.6 g/dL (ref 30.0–36.0)
MCV: 85.4 fL (ref 78.0–100.0)
Platelets: 156 10*3/uL (ref 150–400)
RDW: 15.7 % — ABNORMAL HIGH (ref 11.5–15.5)
WBC: 7 10*3/uL (ref 4.0–10.5)

## 2011-10-13 NOTE — Patient Instructions (Addendum)
   Your procedure is scheduled on: Monday, June 3rd  Enter through the [employer] of [facility] at: 7:30am Pick up the phone at the desk and dial [phone] and inform us of your arrival.  Please call this number if you have any problems the morning of surgery: [phone]  Remember: Do not eat food after midnight: Sunday Do not drink clear liquids after: Sunday Take these medicines the morning of surgery with a SIP OF WATER:  None  Do not wear jewelry, make-up, or FINGER nail polish Do not wear lotions, powders, perfumes or deodorant. Do not shave 48 hours prior to surgery. Do not bring valuables to the hospital. Contacts, dentures or bridgework may not be worn into surgery.   Leave suitcase in the car. After Surgery it may be brought to your room. For patients being admitted to the hospital, checkout time is 11:00am the day of discharge.  Home with Husband Bernette Mayers  cell [phone]  Patients discharged on the day of surgery will not be allowed to drive home.     Remember to use your hibiclens as instructed.Please shower with 1/2 bottle the evening before your surgery and the other 1/2 bottle the morning of surgery. Neck down avoiding private area.

## 2011-10-14 ENCOUNTER — Ambulatory Visit (INDEPENDENT_AMBULATORY_CARE_PROVIDER_SITE_OTHER): Payer: Medicaid Other | Admitting: Obstetrics and Gynecology

## 2011-10-14 ENCOUNTER — Encounter: Payer: Self-pay | Admitting: Obstetrics and Gynecology

## 2011-10-14 VITALS — BP 120/82 | Wt 238.0 lb

## 2011-10-14 DIAGNOSIS — Z331 Pregnant state, incidental: Secondary | ICD-10-CM

## 2011-10-14 NOTE — Progress Notes (Signed)
Pt was seen at [facility] hospital yesterday. She is concerned about why she tested positive for staph.

## 2011-10-14 NOTE — Progress Notes (Signed)
Ready for CS. Chest clear.  Heart regular rate and rhythm.

## 2011-10-18 MED ORDER — CIPROFLOXACIN IN D5W 400 MG/200ML IV SOLN
400.0000 mg | INTRAVENOUS | Status: DC
  Filled 2011-10-18: qty 200

## 2011-10-18 MED ORDER — CLINDAMYCIN PHOSPHATE 900 MG/50ML IV SOLN
900.0000 mg | INTRAVENOUS | Status: DC
  Filled 2011-10-18: qty 50

## 2011-10-19 ENCOUNTER — Inpatient Hospital Stay (HOSPITAL_COMMUNITY): Payer: Medicaid Other | Admitting: Anesthesiology

## 2011-10-19 ENCOUNTER — Encounter (HOSPITAL_COMMUNITY): Payer: Self-pay | Admitting: Anesthesiology

## 2011-10-19 ENCOUNTER — Inpatient Hospital Stay (HOSPITAL_COMMUNITY)
Admission: RE | Admit: 2011-10-19 | Discharge: 2011-10-22 | DRG: 766 | Disposition: A | Discharge status: Home / Self Care | Payer: Medicaid Other | Source: Ambulatory Visit | Attending: Obstetrics and Gynecology | Admitting: Obstetrics and Gynecology

## 2011-10-19 ENCOUNTER — Encounter (HOSPITAL_COMMUNITY)
Admission: RE | Disposition: A | Discharge status: Home / Self Care | Payer: Self-pay | Source: Ambulatory Visit | Attending: Obstetrics and Gynecology

## 2011-10-19 ENCOUNTER — Encounter (HOSPITAL_COMMUNITY): Payer: Self-pay

## 2011-10-19 DIAGNOSIS — D4959 Neoplasm of unspecified behavior of other genitourinary organ: Secondary | ICD-10-CM | POA: Diagnosis present

## 2011-10-19 DIAGNOSIS — O09299 Supervision of pregnancy with other poor reproductive or obstetric history, unspecified trimester: Secondary | ICD-10-CM

## 2011-10-19 DIAGNOSIS — O34599 Maternal care for other abnormalities of gravid uterus, unspecified trimester: Secondary | ICD-10-CM | POA: Diagnosis present

## 2011-10-19 DIAGNOSIS — O321XX0 Maternal care for breech presentation, not applicable or unspecified: Secondary | ICD-10-CM | POA: Diagnosis present

## 2011-10-19 DIAGNOSIS — O26899 Other specified pregnancy related conditions, unspecified trimester: Secondary | ICD-10-CM

## 2011-10-19 DIAGNOSIS — D259 Leiomyoma of uterus, unspecified: Secondary | ICD-10-CM | POA: Diagnosis present

## 2011-10-19 DIAGNOSIS — O34219 Maternal care for unspecified type scar from previous cesarean delivery: Principal | ICD-10-CM | POA: Diagnosis present

## 2011-10-19 DIAGNOSIS — O09529 Supervision of elderly multigravida, unspecified trimester: Secondary | ICD-10-CM | POA: Diagnosis present

## 2011-10-19 DIAGNOSIS — D649 Anemia, unspecified: Secondary | ICD-10-CM | POA: Diagnosis not present

## 2011-10-19 DIAGNOSIS — O9903 Anemia complicating the puerperium: Secondary | ICD-10-CM | POA: Diagnosis not present

## 2011-10-19 DIAGNOSIS — Z98891 History of uterine scar from previous surgery: Secondary | ICD-10-CM | POA: Diagnosis not present

## 2011-10-19 DIAGNOSIS — Z6791 Unspecified blood type, Rh negative: Secondary | ICD-10-CM | POA: Diagnosis present

## 2011-10-19 DIAGNOSIS — O99810 Abnormal glucose complicating pregnancy: Secondary | ICD-10-CM | POA: Insufficient documentation

## 2011-10-19 DIAGNOSIS — Z302 Encounter for sterilization: Secondary | ICD-10-CM

## 2011-10-19 HISTORY — DX: Abnormal glucose complicating pregnancy: O99.810

## 2011-10-19 HISTORY — PX: CYSTOSCOPY: SHX5120

## 2011-10-19 HISTORY — DX: Other specified pregnancy related conditions, unspecified trimester: O26.899

## 2011-10-19 HISTORY — DX: Unspecified blood type, rh negative: Z67.91

## 2011-10-19 LAB — KLEIHAUER-BETKE STAIN
Fetal Cells %: 0.2 %
Quantitation Fetal Hemoglobin: 10 mL

## 2011-10-19 SURGERY — Surgical Case
Anesthesia: Spinal | Site: Bladder | Wound class: Clean Contaminated

## 2011-10-19 MED ORDER — SCOPOLAMINE 1 MG/3DAYS TD PT72
1.0000 | MEDICATED_PATCH | Freq: Once | TRANSDERMAL | Status: DC
  Administered 2011-10-19: 1.5 mg via TRANSDERMAL

## 2011-10-19 MED ORDER — CLINDAMYCIN PHOSPHATE 600 MG/50ML IV SOLN
INTRAVENOUS | Status: DC | PRN
  Administered 2011-10-19: 900 mg via INTRAVENOUS

## 2011-10-19 MED ORDER — IBUPROFEN 600 MG PO TABS
600.0000 mg | ORAL_TABLET | Freq: Four times a day (QID) | ORAL | Status: DC
  Administered 2011-10-19 – 2011-10-22 (×10): 600 mg via ORAL
  Filled 2011-10-19 (×5): qty 1

## 2011-10-19 MED ORDER — KETOROLAC TROMETHAMINE 30 MG/ML IJ SOLN: 30.0000 mg | Freq: Four times a day (QID) | INTRAMUSCULAR | Status: AC | PRN

## 2011-10-19 MED ORDER — OXYCODONE-ACETAMINOPHEN 5-325 MG PO TABS
1.0000 | ORAL_TABLET | ORAL | Status: DC | PRN
  Administered 2011-10-20 – 2011-10-21 (×4): 2 via ORAL
  Administered 2011-10-22 (×2): 1 via ORAL
  Filled 2011-10-19 (×4): qty 2
  Filled 2011-10-19 (×3): qty 1

## 2011-10-19 MED ORDER — MEASLES, MUMPS & RUBELLA VAC ~~LOC~~ INJ
0.5000 mL | INJECTION | Freq: Once | SUBCUTANEOUS | Status: DC
  Filled 2011-10-19: qty 0.5

## 2011-10-19 MED ORDER — NALBUPHINE HCL 10 MG/ML IJ SOLN
5.0000 mg | INTRAMUSCULAR | Status: DC | PRN
  Filled 2011-10-19: qty 1

## 2011-10-19 MED ORDER — ONDANSETRON HCL 4 MG PO TABS: 4.0000 mg | ORAL_TABLET | ORAL | Status: DC | PRN

## 2011-10-19 MED ORDER — OXYTOCIN 20 UNITS IN LACTATED RINGERS INFUSION - SIMPLE: 125.0000 mL/h | INTRAVENOUS | Status: AC

## 2011-10-19 MED ORDER — OXYTOCIN 10 UNIT/ML IJ SOLN
INTRAMUSCULAR | Status: AC
  Filled 2011-10-19: qty 2

## 2011-10-19 MED ORDER — DIPHENHYDRAMINE HCL 25 MG PO CAPS: 25.0000 mg | ORAL_CAPSULE | Freq: Four times a day (QID) | ORAL | Status: DC | PRN

## 2011-10-19 MED ORDER — SENNOSIDES-DOCUSATE SODIUM 8.6-50 MG PO TABS
2.0000 | ORAL_TABLET | Freq: Every day | ORAL | Status: DC
  Administered 2011-10-19 – 2011-10-21 (×3): 2 via ORAL

## 2011-10-19 MED ORDER — DIBUCAINE 1 % RE OINT: 1.0000 "application " | TOPICAL_OINTMENT | RECTAL | Status: DC | PRN

## 2011-10-19 MED ORDER — FERROUS SULFATE 325 (65 FE) MG PO TABS
325.0000 mg | ORAL_TABLET | Freq: Two times a day (BID) | ORAL | Status: DC
  Administered 2011-10-20 – 2011-10-22 (×5): 325 mg via ORAL
  Filled 2011-10-19 (×5): qty 1

## 2011-10-19 MED ORDER — ONDANSETRON HCL 4 MG/2ML IJ SOLN
4.0000 mg | INTRAMUSCULAR | Status: DC | PRN
  Administered 2011-10-19 (×2): 4 mg via INTRAVENOUS
  Filled 2011-10-19 (×2): qty 2

## 2011-10-19 MED ORDER — BUPIVACAINE HCL (PF) 0.25 % IJ SOLN
INTRAMUSCULAR | Status: DC | PRN
  Administered 2011-10-19: 20 mL

## 2011-10-19 MED ORDER — DIPHENHYDRAMINE HCL 50 MG/ML IJ SOLN
12.5000 mg | INTRAMUSCULAR | Status: DC | PRN
  Administered 2011-10-20: 12.5 mg via INTRAVENOUS
  Filled 2011-10-19 (×2): qty 1

## 2011-10-19 MED ORDER — NON FORMULARY: 10.0000 mg | Freq: Every day | Status: DC

## 2011-10-19 MED ORDER — MENTHOL 3 MG MT LOZG: 1.0000 | LOZENGE | OROMUCOSAL | Status: DC | PRN

## 2011-10-19 MED ORDER — EPHEDRINE 5 MG/ML INJ
INTRAVENOUS | Status: AC
  Filled 2011-10-19: qty 10

## 2011-10-19 MED ORDER — EPHEDRINE SULFATE 50 MG/ML IJ SOLN
INTRAMUSCULAR | Status: DC | PRN
  Administered 2011-10-19: 10 mg via INTRAVENOUS

## 2011-10-19 MED ORDER — DIPHENHYDRAMINE HCL 50 MG/ML IJ SOLN: 25.0000 mg | INTRAMUSCULAR | Status: DC | PRN

## 2011-10-19 MED ORDER — FENTANYL CITRATE 0.05 MG/ML IJ SOLN
INTRAMUSCULAR | Status: AC
  Filled 2011-10-19: qty 2

## 2011-10-19 MED ORDER — PRENATAL MULTIVITAMIN CH
1.0000 | ORAL_TABLET | Freq: Every day | ORAL | Status: DC
  Administered 2011-10-20 – 2011-10-22 (×3): 1 via ORAL
  Filled 2011-10-19 (×3): qty 1

## 2011-10-19 MED ORDER — FENTANYL CITRATE 0.05 MG/ML IJ SOLN
INTRAMUSCULAR | Status: DC | PRN
  Administered 2011-10-19: 25 ug via INTRATHECAL

## 2011-10-19 MED ORDER — SIMETHICONE 80 MG PO CHEW: 80.0000 mg | CHEWABLE_TABLET | ORAL | Status: DC | PRN

## 2011-10-19 MED ORDER — WITCH HAZEL-GLYCERIN EX PADS: 1.0000 "application " | MEDICATED_PAD | CUTANEOUS | Status: DC | PRN

## 2011-10-19 MED ORDER — MORPHINE SULFATE (PF) 0.5 MG/ML IJ SOLN
INTRAMUSCULAR | Status: DC | PRN
  Administered 2011-10-19: .5 mg via INTRATHECAL

## 2011-10-19 MED ORDER — MEPERIDINE HCL 25 MG/ML IJ SOLN: 6.2500 mg | INTRAMUSCULAR | Status: DC | PRN

## 2011-10-19 MED ORDER — METHYLERGONOVINE MALEATE 0.2 MG PO TABS: 0.2000 mg | ORAL_TABLET | ORAL | Status: DC | PRN

## 2011-10-19 MED ORDER — ZOLPIDEM TARTRATE 5 MG PO TABS: 5.0000 mg | ORAL_TABLET | Freq: Every evening | ORAL | Status: DC | PRN

## 2011-10-19 MED ORDER — LORATADINE 10 MG PO TABS
10.0000 mg | ORAL_TABLET | Freq: Every day | ORAL | Status: DC
  Filled 2011-10-19 (×4): qty 1

## 2011-10-19 MED ORDER — PHENYLEPHRINE 40 MCG/ML (10ML) SYRINGE FOR IV PUSH (FOR BLOOD PRESSURE SUPPORT)
PREFILLED_SYRINGE | INTRAVENOUS | Status: AC
  Filled 2011-10-19: qty 5

## 2011-10-19 MED ORDER — KETOROLAC TROMETHAMINE 60 MG/2ML IM SOLN
INTRAMUSCULAR | Status: AC
  Administered 2011-10-19: 60 mg
  Filled 2011-10-19: qty 2

## 2011-10-19 MED ORDER — ONDANSETRON HCL 4 MG/2ML IJ SOLN
INTRAMUSCULAR | Status: AC
  Filled 2011-10-19: qty 2

## 2011-10-19 MED ORDER — PHENYLEPHRINE HCL 10 MG/ML IJ SOLN
INTRAMUSCULAR | Status: DC | PRN
  Administered 2011-10-19: 40 ug via INTRAVENOUS
  Administered 2011-10-19 (×3): 80 ug via INTRAVENOUS

## 2011-10-19 MED ORDER — IBUPROFEN 600 MG PO TABS
600.0000 mg | ORAL_TABLET | Freq: Four times a day (QID) | ORAL | Status: DC | PRN
  Filled 2011-10-19 (×5): qty 1

## 2011-10-19 MED ORDER — METOCLOPRAMIDE HCL 5 MG/ML IJ SOLN: 10.0000 mg | Freq: Three times a day (TID) | INTRAMUSCULAR | Status: DC | PRN

## 2011-10-19 MED ORDER — OXYTOCIN 10 UNIT/ML IJ SOLN
20.0000 [IU] | INTRAVENOUS | Status: DC | PRN
  Administered 2011-10-19: 40 [IU] via INTRAVENOUS

## 2011-10-19 MED ORDER — SODIUM CHLORIDE 0.9 % IJ SOLN: 3.0000 mL | INTRAMUSCULAR | Status: DC | PRN

## 2011-10-19 MED ORDER — LACTATED RINGERS IV SOLN
INTRAVENOUS | Status: DC
  Administered 2011-10-19: 19:00:00 via INTRAVENOUS

## 2011-10-19 MED ORDER — BUPIVACAINE IN DEXTROSE 0.75-8.25 % IT SOLN
INTRATHECAL | Status: DC | PRN
  Administered 2011-10-19: 1.7 mL via INTRATHECAL

## 2011-10-19 MED ORDER — LACTATED RINGERS IV SOLN
INTRAVENOUS | Status: DC
  Administered 2011-10-19 (×4): via INTRAVENOUS

## 2011-10-19 MED ORDER — MORPHINE SULFATE 0.5 MG/ML IJ SOLN
INTRAMUSCULAR | Status: AC
  Filled 2011-10-19: qty 10

## 2011-10-19 MED ORDER — BUPIVACAINE HCL (PF) 0.25 % IJ SOLN
INTRAMUSCULAR | Status: AC
  Filled 2011-10-19: qty 30

## 2011-10-19 MED ORDER — DIPHENHYDRAMINE HCL 25 MG PO CAPS: 25.0000 mg | ORAL_CAPSULE | ORAL | Status: DC | PRN

## 2011-10-19 MED ORDER — NALOXONE HCL 0.4 MG/ML IJ SOLN: 0.4000 mg | INTRAMUSCULAR | Status: DC | PRN

## 2011-10-19 MED ORDER — ONDANSETRON HCL 4 MG/2ML IJ SOLN
INTRAMUSCULAR | Status: DC | PRN
  Administered 2011-10-19: 4 mg via INTRAVENOUS

## 2011-10-19 MED ORDER — STERILE WATER FOR IRRIGATION IR SOLN
Status: DC | PRN
  Administered 2011-10-19: 2000 mL via INTRAVESICAL

## 2011-10-19 MED ORDER — FENTANYL CITRATE 0.05 MG/ML IJ SOLN: 25.0000 ug | INTRAMUSCULAR | Status: DC | PRN

## 2011-10-19 MED ORDER — SCOPOLAMINE 1 MG/3DAYS TD PT72
MEDICATED_PATCH | TRANSDERMAL | Status: AC
  Administered 2011-10-19: 1.5 mg via TRANSDERMAL
  Filled 2011-10-19: qty 1

## 2011-10-19 MED ORDER — TETANUS-DIPHTH-ACELL PERTUSSIS 5-2.5-18.5 LF-MCG/0.5 IM SUSP
0.5000 mL | Freq: Once | INTRAMUSCULAR | Status: AC
  Administered 2011-10-20: 0.5 mL via INTRAMUSCULAR
  Filled 2011-10-19: qty 0.5

## 2011-10-19 MED ORDER — INDIGOTINDISULFONATE SODIUM 8 MG/ML IJ SOLN
INTRAMUSCULAR | Status: AC
  Filled 2011-10-19: qty 5

## 2011-10-19 MED ORDER — ONDANSETRON HCL 4 MG/2ML IJ SOLN: 4.0000 mg | Freq: Three times a day (TID) | INTRAMUSCULAR | Status: DC | PRN

## 2011-10-19 MED ORDER — LANOLIN HYDROUS EX OINT: 1.0000 "application " | TOPICAL_OINTMENT | CUTANEOUS | Status: DC | PRN

## 2011-10-19 MED ORDER — SODIUM CHLORIDE 0.9 % IV SOLN
1.0000 ug/kg/h | INTRAVENOUS | Status: DC | PRN
  Filled 2011-10-19: qty 2.5

## 2011-10-19 MED ORDER — METHYLERGONOVINE MALEATE 0.2 MG/ML IJ SOLN: 0.2000 mg | INTRAMUSCULAR | Status: DC | PRN

## 2011-10-19 MED ORDER — SIMETHICONE 80 MG PO CHEW
80.0000 mg | CHEWABLE_TABLET | Freq: Three times a day (TID) | ORAL | Status: DC
  Administered 2011-10-19 – 2011-10-22 (×9): 80 mg via ORAL

## 2011-10-19 MED ORDER — INDIGOTINDISULFONATE SODIUM 8 MG/ML IJ SOLN
INTRAMUSCULAR | Status: DC | PRN
  Administered 2011-10-19: 5 mL via INTRAVENOUS

## 2011-10-19 SURGICAL SUPPLY — 43 items
BENZOIN TINCTURE PRP APPL 2/3 (GAUZE/BANDAGES/DRESSINGS) ×3 IMPLANT
BOOTIES KNEE HIGH SLOAN (MISCELLANEOUS) ×6 IMPLANT
CHLORAPREP W/TINT 26ML (MISCELLANEOUS) ×3 IMPLANT
CLOTH BEACON ORANGE TIMEOUT ST (SAFETY) ×3 IMPLANT
CONTAINER PREFILL 10% NBF 15ML (MISCELLANEOUS) ×3 IMPLANT
DRAIN JACKSON PRT FLT 10 (DRAIN) IMPLANT
DRAPE PROXIMA HALF (DRAPES) ×3 IMPLANT
DRESSING TELFA 8X3 (GAUZE/BANDAGES/DRESSINGS) ×3 IMPLANT
ELECT REM PT RETURN 9FT ADLT (ELECTROSURGICAL) ×3
ELECTRODE REM PT RTRN 9FT ADLT (ELECTROSURGICAL) ×2 IMPLANT
EVACUATOR SILICONE 100CC (DRAIN) IMPLANT
EXTRACTOR VACUUM M CUP 4 TUBE (SUCTIONS) IMPLANT
GAUZE SPONGE 4X4 12PLY STRL LF (GAUZE/BANDAGES/DRESSINGS) ×6 IMPLANT
GLOVE BIOGEL PI IND STRL 7.0 (GLOVE) ×4 IMPLANT
GLOVE BIOGEL PI INDICATOR 7.0 (GLOVE) ×2
GLOVE ECLIPSE 6.5 STRL STRAW (GLOVE) ×3 IMPLANT
GLOVE SURG SS PI 7.5 STRL IVOR (GLOVE) ×12 IMPLANT
GOWN PREVENTION PLUS LG XLONG (DISPOSABLE) ×9 IMPLANT
KIT ABG SYR 3ML LUER SLIP (SYRINGE) IMPLANT
NEEDLE HYPO 22GX1.5 SAFETY (NEEDLE) ×3 IMPLANT
NEEDLE HYPO 25X5/8 SAFETYGLIDE (NEEDLE) IMPLANT
NS IRRIG 1000ML POUR BTL (IV SOLUTION) ×6 IMPLANT
PACK C SECTION WH (CUSTOM PROCEDURE TRAY) ×3 IMPLANT
PAD ABD 7.5X8 STRL (GAUZE/BANDAGES/DRESSINGS) ×3 IMPLANT
RETRACTOR WND ALEXIS 25 LRG (MISCELLANEOUS) ×2 IMPLANT
RTRCTR C-SECT PINK 25CM LRG (MISCELLANEOUS) IMPLANT
RTRCTR WOUND ALEXIS 25CM LRG (MISCELLANEOUS) ×3
SET CYSTO W/LG BORE CLAMP LF (SET/KITS/TRAYS/PACK) ×3 IMPLANT
SLEEVE SCD COMPRESS KNEE MED (MISCELLANEOUS) IMPLANT
SPONGE GAUZE 4X4 12PLY (GAUZE/BANDAGES/DRESSINGS) ×3 IMPLANT
SPONGE LAP 18X18 X RAY DECT (DISPOSABLE) ×9 IMPLANT
STRIP CLOSURE SKIN 1/2X4 (GAUZE/BANDAGES/DRESSINGS) ×3 IMPLANT
SUT CHROMIC GUT AB #0 18 (SUTURE) IMPLANT
SUT MNCRL AB 3-0 PS2 27 (SUTURE) ×3 IMPLANT
SUT SILK 2 0 FSL 18 (SUTURE) IMPLANT
SUT VIC AB 0 CTX 36 (SUTURE) ×4
SUT VIC AB 0 CTX36XBRD ANBCTRL (SUTURE) ×8 IMPLANT
SUT VIC AB 1 CT1 36 (SUTURE) ×6 IMPLANT
SYR 20CC LL (SYRINGE) ×3 IMPLANT
TAPE CLOTH SURG 4X10 WHT LF (GAUZE/BANDAGES/DRESSINGS) ×3 IMPLANT
TOWEL OR 17X24 6PK STRL BLUE (TOWEL DISPOSABLE) ×6 IMPLANT
TRAY FOLEY CATH 14FR (SET/KITS/TRAYS/PACK) ×6 IMPLANT
WATER STERILE IRR 1000ML POUR (IV SOLUTION) ×3 IMPLANT

## 2011-10-19 NOTE — Transfer of Care (Signed)
Immediate Anesthesia Transfer of Care Note  Patient: Brenda Valencia  Procedure(s) Performed: Procedure(s) (LRB): CESAREAN SECTION WITH BILATERAL TUBAL LIGATION (Bilateral) CYSTOSCOPY (N/A)  Patient Location: PACU  Anesthesia Type: Spinal  Level of Consciousness: awake  Airway & Oxygen Therapy: Patient Spontanous Breathing  Post-op Assessment: Report given to PACU RN  Post vital signs: Reviewed and stable  Complications: No apparent anesthesia complications

## 2011-10-19 NOTE — H&P (Signed)
Brenda Valencia is a 41 y.o. female presenting at 39 weeks for scheduled repeat c/s and bilateral tubal ligation.  She presents this morning w/o complaints.  She is accompanied by her husband, "Bernette Mayers."  Last solid food around 2200 last night.  Denies any recent illness or fever.  No VB or LOF.  GFM.  No PIH or UTI s/s.   Prenatal course:  Pt had viability u/s around 6 weeks.  She started care at [facility] at 8 weeks.  Declined aneuploidy screens.  Normal anatomy u/s.  Elevated early 1hr gtt, but normal 3hr gtt x2.  Pt received Rhophylac third trimester.  She did have some early pregnancy weight loss r/t n/v.  Normal pregnancy discomforts.  Malpresentation has been noted since around 24 weeks.  She last had u/s at 36 weeks and fetus remained complete breech.  Mild fetal pyelectasis noted at 31 weeks.    OB Hx: G1=2006:  Ectopic at 5-6 weeks & trx w/ MTX G2=2008; primary LTCS for NRFHT; IOL at 40 weeks for Oligo; Female=7+11. G3=current  Maternal Medical History:  Contractions: Frequency: rare.    Fetal activity: Perceived fetal activity is normal.   Last perceived fetal movement was within the past hour.    Prenatal complications: 1.  Previous c/s and desires repeat and BTL 2.  Obese 3.  AMA 4.  Rh neg 5.  Fibroid uterus 6.  H/o ectopic '06 w/ MTX 7.  Elevated early 1hr gtt, but normal 3hr gttx2 8.  Breech presentation 9. PCN allergy    OB History    Grav Para Term Preterm Abortions TAB SAB Ect Mult Living   3 1 1  1   1  1      Past Medical History  Diagnosis Date  . Ectopic pregnancy     Treated with MTX  . Fibroids   . H/O varicella   . Seasonal allergies   . Headache     otc med prn   Past Surgical History  Procedure Date  . Cesarean section 07/2006    NRFHR  . Axillary nodes      2002  . Wisdom tooth extraction     as teenager   Family History: family history includes Alcohol abuse in her brother and father; Arthritis in her mother; Cancer in her mother; Heart  disease in her father; and Hypertension in her father and mother. Social History:  reports that she has never smoked. She has never used smokeless tobacco. She reports that she does not drink alcohol or use illicit drugs.Pt is SAHM.  Pt's husband "Bernette Mayers" is a Training and development officer."  He is involved and supportive.  Pt has had 14 years of education.  Review of Systems  Constitutional: Negative.   HENT: Negative.   Eyes: Negative.   Respiratory: Negative.   Cardiovascular: Positive for leg swelling.  Gastrointestinal: Positive for heartburn.       Antacids prn  Genitourinary: Negative.   Skin: Negative.   Neurological: Negative.   FHT's positive in preop by RN    Blood pressure 147/84, pulse 100, temperature 98.4 F (36.9 C), temperature source Oral, resp. rate 18, last menstrual period 01/10/2011, SpO2 100.00%. ..  Maternal Exam:  Uterine Assessment: Contraction frequency is rare.   Abdomen: Patient reports no abdominal tenderness. Surgical scars: low transverse.   Fetal presentation: breech  Introitus: not evaluated.   Cervix: not evaluated.   Physical Exam  Constitutional: She is oriented to person, place, and time. She appears well-developed and well-nourished. No  distress.  HENT:  Head: Normocephalic and atraumatic.  Eyes: Pupils are equal, round, and reactive to light.  Cardiovascular: Normal rate and regular rhythm.   Respiratory: Effort normal and breath sounds normal.  GI: Soft. Bowel sounds are normal.       gravid  Genitourinary:       deferred  Musculoskeletal: She exhibits edema.       1-2+ BLE pitting edema; no clonus; DTRs 1+  Neurological: She is alert and oriented to person, place, and time.  Skin: Skin is warm and dry.  Psychiatric: Her behavior is normal. Thought content normal.    Prenatal labs: ABO, Rh: --/--/B NEG (04/04 0806) Antibody: NEG (04/04 0806) Rubella:  immune RPR: NON REACTIVE (05/28 0949)  HBsAg:   negative HIV:   nonreactive GBS:    negative Otis screen negative Assessment/Plan: 1.  39 weeks 2.  Previous c/s w/ desires for repeat and BTL 3.  Positive staph screen at pre-op appt 4.  Desires inpatient circ 5.  Plans Formula-feed 6.  PCN allergy 7.  GBS neg 8.  Rh neg 9.  Breech presentation on last u/s 10.  Fibroid uterus  1.  Admit to [facility] with Dr. Estanislado Pandy as attending with routine preop orders  2.  VBAC vs repeat c/s options given; r/b/a rev'd.  Risk of c/s include but not limited to damage to surrounding organs, bleeding, infection, and anesthesia complications.  Following discussion, pt verbalized desire to proceed with repeat c/s and sterilization, verbalizing understanding of these risk 3.  MD to follow.  Austan Nicholl H 10/19/2011, 8:27 AM

## 2011-10-19 NOTE — Anesthesia Preprocedure Evaluation (Signed)
Anesthesia Evaluation  Patient identified by MRN, date of birth, ID band Patient awake    Reviewed: Allergy & Precautions, H&P , Patient's Chart, lab work & pertinent test results  Airway Mallampati: III TM Distance: >3 FB Neck ROM: full    Dental No notable dental hx. (+) Teeth Intact   Pulmonary neg pulmonary ROS,  breath sounds clear to auscultation  Pulmonary exam normal       Cardiovascular negative cardio ROS  Rhythm:regular Rate:Normal     Neuro/Psych  Headaches, negative psych ROS   GI/Hepatic negative GI ROS, Neg liver ROS,   Endo/Other  Morbid obesity  Renal/GU negative Renal ROS  negative genitourinary   Musculoskeletal   Abdominal Normal abdominal exam  (+)   Peds  Hematology negative hematology ROS (+)   Anesthesia Other Findings   Reproductive/Obstetrics (+) Pregnancy                           Anesthesia Physical Anesthesia Plan  ASA: III  Anesthesia Plan: Spinal   Post-op Pain Management:    Induction:   Airway Management Planned:   Additional Equipment:   Intra-op Plan:   Post-operative Plan:   Informed Consent: I have reviewed the patients History and Physical, chart, labs and discussed the procedure including the risks, benefits and alternatives for the proposed anesthesia with the patient or authorized representative who has indicated his/her understanding and acceptance.     Plan Discussed with: Anesthesiologist  Anesthesia Plan Comments:         Anesthesia Quick Evaluation

## 2011-10-19 NOTE — Interval H&P Note (Signed)
History and Physical Interval Note:  10/19/2011 9:02 AM  Brenda Valencia  has presented today for surgery, with the diagnosis of Interuterine pregnancy,desire for repeat cesarean section,desire for sterilization  The various methods of treatment have been discussed with the patient and family. After consideration of risks, benefits and other options for treatment, the patient has consented to  Procedure(s) (LRB): CESAREAN SECTION WITH BILATERAL TUBAL LIGATION (Bilateral) as a surgical intervention .  The patients' history has been reviewed, patient examined, no change in status, stable for surgery.  I have reviewed the patients' chart and labs.  Questions were answered to the patient's satisfaction.     [name]

## 2011-10-19 NOTE — Brief Op Note (Signed)
10/19/2011  11:32 AM  PATIENT:  Brenda Valencia  41 y.o. female  PRE-OPERATIVE DIAGNOSIS:  Interuterine pregnancy,desire for repeat cesarean section,desire for sterilization  POST-OPERATIVE DIAGNOSIS:  Intrauterine pregnancy; Desire for Repeat Cesarean Section; Desire for Sterilization    Procedure(s): CESAREAN SECTION WITH BILATERAL TUBAL LIGATION CYSTOSCOPY    Surgeon(s): Esmeralda Arthur, MD   ASSISTANTS: French Ana CNM   ANESTHESIA:   spinal  LOCAL MEDICATIONS USED:  MARCAINE     SPECIMEN:  Placenta  DISPOSITION OF SPECIMEN:  L & D  COUNTS:  YES  ESTIMATED BLOOD LOSS: 700 cc  PATIENT DISPOSITION:  PACU - hemodynamically stable.      Jared Cahn A MD 10/19/2011 11:32 AM

## 2011-10-19 NOTE — Progress Notes (Signed)
Brenda Valencia from the hospital lab called this RN to report a blood type discrepancy. Blood type had previously been recorded as B positive and the last two times as B negative according to [facility], CNM. This RN called French Ana, CNM to report this discrepancy. She was aware of the situation and told this RN she wanted to contact Dr. Estanislado Pandy before making any decisions and putting in any orders. This RN called the lab back and spoke to [employer] and told her to hold off on any lab work until the doctors could discuss the situation. This RN had put in the rhogam studies earlier today when the baby's blood type came back as A positive and mom is B negative in the system. The lab did not contact this RN until 1920 to report the situation. This RN reported to situation to the oncoming nurse Pearla Dubonnet, RN. No rhogam has been given and will wait until further orders from the doctor.

## 2011-10-19 NOTE — Op Note (Signed)
Preoperative diagnosis: Intrauterine pregnancy at 39 weeks, desire for repeat cesarean, desire for sterilization  Post operative diagnosis: Same  Anesthesia: Spinal  Anesthesiologist: Dr. Mal Amabile  Procedure: Repeat low transverse cesarean section, bilateral tubal ligation, cystoscopy  Surgeon: Dr. Dois Davenport Raeli Wiens  Assistant: French Ana CNM  Estimated blood loss: 700 cc  Procedure:  After being informed of the planned procedure and possible complications including bleeding, infection, injury to other organs, informed consent is obtained. The patient is taken to OR #9 and given spinal anesthesia without complication. She is placed in the dorsal decubitus position with the pelvis tilted to the left. She is then prepped and draped in a sterile fashion. A Foley catheter is inserted in her bladder.  After assessing adequate level of anesthesia, we infiltrate the suprapubic area with 20 cc of Marcaine 0.25 and perform a Pfannenstiel incision which is brought down sharply to the fascia. The fascia is entered in a low transverse fashion. Linea alba is dissected. Peritoneum is entered in a midline fashion. An Alexis retractor is easily positioned. Visceral peritoneum is entered in a low transverse fashion allowing [place] to safely retract bladder by developing a bladder flap.  The myometrium is then entered in a low transverse fashion; first with knife and then extended bluntly. Amniotic fluid is minimal and meconium stained. We assist the birth of a female  infant in vertex presentation. Mouth and nose are suctioned. The baby is delivered. The cord is clamped and sectioned. The baby is given to the neonatologist present in the room.  10 cc of blood is drawn from the umbilical vein.The placenta is allowed to deliver spontaneously. It is complete and the cord has 3 vessels. Uterine revision is negative.  We proceed with closure of the myometrium in 2 layers: First with a running locked suture of  0 Vicryl, then with a Lembert suture of 0 Vicryl imbricating the first one.Multiple figure-of-eight 0 Vicryl stitches are required to achieve hemostasis in particular at the left angle. Hemostasis is completed with cauterization on peritoneal edges.  Both paracolic gutters are cleaned. Both tubes and ovaries are assessed and normal. The pelvis is profusely irrigated with warm saline to confirm a satisfactory hemostasis.  We then proceed with bilateral tubal ligation. The left tube is isolated with 2 Babcock forceps. Its mesosalpinx was entered with cauterization. We doubly ligate the proximal portion and the distal portion with 0 chromic. We removed  a 1.5 cm section of a isthmo-ampullary tube. Both stumps are then cauterized and hemostasis is adequate. We proceed in exact same fashion on the right side.  Retractors and sponges are removed. Under fascia hemostasis is completed with cauterization. The fascia is then closed with 2 running sutures of 0 Vicryl meeting midline. The wound is irrigated with warm saline and hemostasis is completed with cauterization. The skin is closed with a subcuticular suture of 3-0 Monocryl and Steri-Strips.  Instrument and sponge count is complete x2. Estimated blood loss is 700 cc.  We then repositioned the patient in lithotomy, prepped and draped her again. Her Foley catheter is removed. We proceed with cystoscopy to confirm ureteral integrity. The patient is given IV indigo carmine. Cystoscopy is performed to easily visualize an intact bladder and 2 normal ureteral jets. Cystoscope is removed and a new Foley catheter is inserted in the bladder.  The procedure is well tolerated by the patient who is taken to recovery room in a well and stable condition.  female baby named Wilber Oliphant was born at 9:52  and received an Apgar of 8  at 1 minute and 9 at 5 minutes.    Specimen: Placenta sent to L & D and 2 segments of tube sent to pathology   [facility] A MD 6/3/201311:33  AM

## 2011-10-19 NOTE — Anesthesia Postprocedure Evaluation (Signed)
  Anesthesia Post Note  Patient: Brenda Valencia  Procedure(s) Performed: Procedure(s) (LRB): CESAREAN SECTION WITH BILATERAL TUBAL LIGATION (Bilateral) CYSTOSCOPY (N/A)  Anesthesia type: Spinal  Patient location: Mother/Baby  Post pain: Pain level controlled  Post assessment: Post-op Vital signs reviewed  Last Vitals:  Filed Vitals:   10/19/11 1507  BP: 127/72  Pulse: 81  Temp: 36.3 C  Resp: 18    Post vital signs: Reviewed  Level of consciousness: awake  Complications: No apparent anesthesia complications

## 2011-10-20 ENCOUNTER — Encounter (HOSPITAL_COMMUNITY): Payer: Self-pay | Admitting: Obstetrics and Gynecology

## 2011-10-20 LAB — CBC
MCHC: 31.2 g/dL (ref 30.0–36.0)
RDW: 15.9 % — ABNORMAL HIGH (ref 11.5–15.5)

## 2011-10-20 MED ORDER — RHO D IMMUNE GLOBULIN 1500 UNIT/2ML IJ SOLN
300.0000 ug | Freq: Once | INTRAMUSCULAR | Status: AC
  Administered 2011-10-20: 300 ug via INTRAMUSCULAR
  Filled 2011-10-20: qty 2

## 2011-10-20 NOTE — Progress Notes (Signed)
Spoke with Brenda Valencia about discrepancy with blood type. Brenda Valencia states that blood type is B neg and to administer Rhophylac.

## 2011-10-20 NOTE — Progress Notes (Signed)
Spoke with French Ana, CNM regarding confirmation to discontinue foley catheter due to cystoscopy performed 10/19/2011. Per French Ana, CNM, foley catheter can be discontinued per protocol.

## 2011-10-20 NOTE — Progress Notes (Signed)
UR chart review completed.  

## 2011-10-20 NOTE — Progress Notes (Addendum)
Subjective: Postpartum Day 1: Cesarean Delivery--repeat, with BTL Patient reports doing well, up ad lib without syncope or dizziness.  Working on breastfeeding.   Objective: Vital signs in last 24 hours: Temp:  [97.4 F (36.3 C)-98.8 F (37.1 C)] 98.8 F (37.1 C) (06/04 1000) Pulse Rate:  [81-100] 89  (06/04 1000) Resp:  [16-22] 20  (06/04 1000) BP: (118-148)/(70-89) 122/83 mmHg (06/04 1000) SpO2:  [96 %-98 %] 98 % (06/04 1000)  Physical Exam:  General: alert Lochia: appropriate Uterine Fundus: firm Incision: Dressing CDI DVT Evaluation: No evidence of DVT seen on physical exam. Negative Homan's sign.   Basename 10/20/11 0520  HGB 9.3*  HCT 29.8*   Hgb pre-op 10.9  Blood type verified at B negative by 2 previous typing at [facility]  Assessment/Plan: Status post Cesarean section. Doing well postoperatively.  Continue current care. Anemia--stable hemodynamically.  Nigel Bridgeman 10/20/2011, 1:27 PM

## 2011-10-21 DIAGNOSIS — Z98891 History of uterine scar from previous surgery: Secondary | ICD-10-CM | POA: Diagnosis not present

## 2011-10-21 NOTE — Progress Notes (Addendum)
Subjective: Postpartum Day 2: Cesarean Delivery Patient reports incisional pain, tolerating PO and no problems voiding.    Objective: Vital signs in last 24 hours: Temp:  [97.5 F (36.4 C)-98.2 F (36.8 C)] 98.2 F (36.8 C) (06/05 0555) Pulse Rate:  [71-84] 82  (06/05 0555) Resp:  [18-20] 18  (06/05 0555) BP: (106-124)/(69-84) 124/84 mmHg (06/05 0555)  Physical Exam:  General: alert and cooperative Lochia: appropriate Uterine Fundus: firm Incision: healing well DVT Evaluation: No evidence of DVT seen on physical exam.   Basename 10/20/11 0520  HGB 9.3*  HCT 29.8*    Assessment/Plan: Status post Cesarean section and bilateral tubal sterilization procedure. Doing well postoperatively.  PP anemia. Continue current care. Circumcision discussed.  Tyjay Galindo V 10/21/2011, 10:39 AM

## 2011-10-21 NOTE — Progress Notes (Signed)
TC to [employer], [employer]. Patient has had blood typing x 5 during this pregnancy--3 have been B negative, 2 have shown B positive. Patient may have low levels of Rh antibodies that vary at times.  More sophisticated testing of blood is possible, but is costly.  We will defer this. I advised her I wanted patient to have Rhophylac today, given the possibility that she is predominantly B negative.  Patient received Rhophylac this evening. Patient aware of this issue and agreeable with receiving Rhophylac.  Nigel Bridgeman, CNM, [place] 10/20/11 5:30p

## 2011-10-21 NOTE — Anesthesia Postprocedure Evaluation (Signed)
  Anesthesia Post-op Note  Patient: Brenda Valencia  Procedure(s) Performed: Procedure(s) (LRB): CESAREAN SECTION WITH BILATERAL TUBAL LIGATION (Bilateral) CYSTOSCOPY (N/A)  Patient Location: PACU  Anesthesia Type: Spinal  Level of Consciousness: awake, alert  and oriented  Airway and Oxygen Therapy: Patient Spontanous Breathing  Post-op Pain: mild  Post-op Assessment: Post-op Vital signs reviewed, Patient's Cardiovascular Status Stable, Respiratory Function Stable, Patent Airway, No signs of Nausea or vomiting, Pain level controlled, No headache, No backache, No residual numbness and No residual motor weakness  Post-op Vital Signs: Reviewed and stable  Complications: No apparent anesthesia complications

## 2011-10-22 MED ORDER — IBUPROFEN 600 MG PO TABS: 600.0000 mg | ORAL_TABLET | Freq: Four times a day (QID) | ORAL | Status: AC | PRN

## 2011-10-22 MED ORDER — OXYCODONE-ACETAMINOPHEN 5-325 MG PO TABS: 1.0000 | ORAL_TABLET | ORAL | Status: AC | PRN

## 2011-10-22 NOTE — Discharge Instructions (Signed)

## 2011-10-22 NOTE — Discharge Summary (Signed)
Obstetric Discharge Summary Reason for Admission: cesarean section, repeat, with BTL Prenatal Procedures: NST and ultrasound Intrapartum Procedures: cesarean: low cervical, transverse, repeat, with BTL Postpartum Procedures: Rho(D) Ig Complications-Operative and Postpartum: none Hemoglobin  Date Value Range Status  10/20/2011 9.3* 12.0-15.0 (g/dL) Final     HCT  Date Value Range Status  10/20/2011 29.8* 36.0-46.0 (%) Final   Patient was admitted on 10/19/11 for a scheduled repeat cesarean delivery and BTL.   She was taken to the operating room, where Dr.  Estanislado Pandy performed a repeat LTCS and BTL under spinal anesthesia, with delivery of a viable female, with weight and Apgars as listed below. Infant was in good condition and remained at the patient's bedside.  The patient was taken to recovery in good condition.  Patient planned to bottle feed.  On post-op day 1, patient was doing well, tolerating a regular diet, with Hgb of 9.3.  Throughout her stay, her physical exam was WNL, her incision was CDI, and her vital signs remained stable.  By post-op day 3, she was up ad lib, tolerating a regular diet, with good pain control with po med.  She was deemed to have received the full benefit of her hospital stay, and was discharged home in stable condition.    Per review of labs, patient has had blood typing several times during her pregnancy, since in early pregnancy, she was typed as B negative, then at NOB, B positive.  During the pregnancy, overall she had 3 samples that were B negative and 2 that showed B positive.  Per lab supervisor, patient may have low level of blood antigens that vary at times, giving different typing results.  There is advanced testing available for further investigation, but this was deferred.  For safety, Rhophylac was given with patient consent.    Physical Exam:  General: alert Lochia: appropriate Uterine Fundus: firm Incision: healing well DVT Evaluation: No evidence of DVT  seen on physical exam. Negative Homan's sign.  Discharge Diagnoses: Term Pregnancy-delivered, repeat LTCS with BTL  Discharge Information: Date: 10/22/2011 Activity: Per [facility] Diet: routine Medications: Ibuprofen, Percocet and OTC Fe Condition: stable Instructions: refer to practice specific booklet Discharge to: home Follow-up Information    Follow up with [facility] in 6 weeks.         Newborn Data: Live born female  Birth Weight: 7 lb 6.5 oz (3360 g) APGAR: 8, 9  Home with mother Circumcision done at the hospital.  Nigel Bridgeman 10/22/2011, 8:07 AM

## 2011-10-24 LAB — RH IG WORKUP (INCLUDES ABO/RH): Unit division: 0

## 2011-11-10 ENCOUNTER — Telehealth: Payer: Self-pay | Admitting: Obstetrics and Gynecology

## 2011-11-24 ENCOUNTER — Encounter: Payer: Self-pay | Admitting: Obstetrics and Gynecology

## 2011-11-24 ENCOUNTER — Ambulatory Visit (INDEPENDENT_AMBULATORY_CARE_PROVIDER_SITE_OTHER): Payer: Medicaid Other | Admitting: Obstetrics and Gynecology

## 2011-11-24 NOTE — Progress Notes (Signed)
Brenda Valencia  is 5 weeks postpartum following a primary cesarean section, low transverse incision at 39 gestational weeks Date: 10/19/2011 female baby named Caleb delivered by Drew Herman.  Breastfeeding: no Bottlefeeding:  yes  Post-partum blues / depression:  no  EPDS score: 2 History of abnormal Pap:  no  Last Pap: Date  06/01/2011 Gestational diabetes:  no  Contraception:  Has BTL  Normal urinary function:  yes Normal GI function:  yes Returning to work:  No  Subjective:     Brenda Valencia is a 41 y.o. female who presents for a postpartum visit.  I have fully reviewed the prenatal and intrapartum course. The patient has no complaints.  The following portions of the patient's history were reviewed and updated as appropriate: allergies, current medications, past family history, past medical history, past social history, past surgical history and problem list.  Review of Systems Pertinent items are noted in HPI.   Objective:    BP 118/64  Ht 5' 5.5" (1.664 m)  Wt 214 lb (97.07 kg)  BMI 35.07 kg/m2  Breastfeeding? No  General:  alert, cooperative and no distress     Lungs: clear to auscultation bilaterally  Heart:  regular rate and rhythm, S1, S2 normal, no murmur  Abdomen: soft, non-tender; bowel sounds normal; no masses,  no organomegaly incision healed   Vulva:  normal  Vagina: normal vagina  Cervix:  normal  Corpus: normal size, contour, position, consistency, mobility, non-tender  Adnexa:  normal adnexa             Assessment:     Normal postpartum exam.  Pap smear not done at today's visit.   Plan:  AEX 05/2012  Javien Tesch A MD 11/24/2011 3:41 PM

## 2012-07-21 ENCOUNTER — Emergency Department (HOSPITAL_COMMUNITY)
Admission: EM | Admit: 2012-07-21 | Discharge: 2012-07-21 | Disposition: A | Discharge status: Home / Self Care | Payer: Self-pay | Attending: Emergency Medicine | Admitting: Emergency Medicine

## 2012-07-21 ENCOUNTER — Encounter (HOSPITAL_COMMUNITY): Payer: Self-pay | Admitting: Emergency Medicine

## 2012-07-21 DIAGNOSIS — Z8619 Personal history of other infectious and parasitic diseases: Secondary | ICD-10-CM | POA: Insufficient documentation

## 2012-07-21 DIAGNOSIS — Z8742 Personal history of other diseases of the female genital tract: Secondary | ICD-10-CM | POA: Insufficient documentation

## 2012-07-21 DIAGNOSIS — IMO0002 Reserved for concepts with insufficient information to code with codable children: Secondary | ICD-10-CM | POA: Insufficient documentation

## 2012-07-21 DIAGNOSIS — L0291 Cutaneous abscess, unspecified: Secondary | ICD-10-CM

## 2012-07-21 MED ORDER — LIDOCAINE-EPINEPHRINE 2 %-1:100000 IJ SOLN
20.0000 mL | Freq: Once | INTRAMUSCULAR | Status: AC
  Administered 2012-07-21: 20 mL
  Filled 2012-07-21: qty 1

## 2012-07-21 MED ORDER — TRAMADOL HCL 50 MG PO TABS: 50.0000 mg | ORAL_TABLET | Freq: Four times a day (QID) | ORAL | Status: DC | PRN

## 2012-07-21 NOTE — ED Provider Notes (Signed)
History     CSN: [id number]  Arrival date & time 07/21/12  1039   First MD Initiated Contact with Patient 07/21/12 1054      Chief Complaint  Patient presents with  . Abscess    (Consider location/radiation/quality/duration/timing/severity/associated sxs/prior treatment) HPI  Brenda Valencia is a 42 y.o. female complaining of abscess in her right arm worsening over the course of 2 weeks. Patient states that it is actively draining but it has been draining for several days and has not stopped. She denies fever, nausea vomiting. Patient's had multiple prior similar episodes.   Past Medical History  Diagnosis Date  . Ectopic pregnancy     Treated with MTX  . Fibroids   . H/O varicella   . Seasonal allergies   . Headache     otc med prn  . Rh negative status during pregnancy 10/19/2011  . Abnormal glucose tolerance in mother complicating pregnancy 10/19/2011    Elevated early 1hr gtt; normal 3hr gtt x2    Past Surgical History  Procedure Laterality Date  . Cesarean section  07/2006    NRFHR  . Axillary nodes       2002  . Wisdom tooth extraction      as teenager  . Cystoscopy  10/19/2011    Procedure: CYSTOSCOPY;  Surgeon: Esmeralda Arthur, MD;  Location: [facility];  Service: Gynecology;  Laterality: N/A;  . Tubal ligation      Family History  Problem Relation Age of Onset  . Hypertension Mother   . Arthritis Mother   . Cancer Mother   . Heart disease Father   . Hypertension Father   . Alcohol abuse Father   . Alcohol abuse Brother     History  Substance Use Topics  . Smoking status: Never Smoker   . Smokeless tobacco: Never Used  . Alcohol Use: No    OB History   Grav Para Term Preterm Abortions TAB SAB Ect Mult Living   3 2 2  1   1  2       Review of Systems  Allergies  Penicillins  Home Medications   Current Outpatient Rx  Name  Route  Sig  Dispense  Refill  . Naproxen Sodium (ALEVE PO)   Oral   Take 220 mg by mouth daily as needed (pain).            BP 135/93  Pulse 106  Temp(Src) 98.5 F (36.9 C) (Oral)  SpO2 100%  Physical Exam  Nursing note and vitals reviewed. Constitutional: She is oriented to person, place, and time. She appears well-developed and well-nourished. No distress.  HENT:  Head: Normocephalic.  Mouth/Throat: Oropharynx is clear and moist.  Eyes: Conjunctivae and EOM are normal. Pupils are equal, round, and reactive to light.  Neck: Normal range of motion.  Cardiovascular: Normal rate, regular rhythm and intact distal pulses.   Pulmonary/Chest: Effort normal and breath sounds normal. No stridor. No respiratory distress. She has no wheezes. She has no rales. She exhibits no tenderness.  Abdominal: Soft. Bowel sounds are normal. She exhibits no distension and no mass. There is no tenderness. There is no rebound and no guarding.  Musculoskeletal: Normal range of motion.  Neurological: She is alert and oriented to person, place, and time.  Skin:  Fluctuant actively draining 5 cm abscess to right axilla with minimal surrounding cellulitis and tenderness  Psychiatric: She has a normal mood and affect.    ED Course  Procedures (including critical  care time)  INCISION AND DRAINAGE Performed by: Wynetta Emery Consent: Verbal consent obtained. Risks and benefits: risks, benefits and alternatives were discussed Type: abscess  Body area: Right axilla  Anesthesia: local infiltration  Incision was made with a scalpel.  Local anesthetic: lidocaine 2% with epinephrine  Anesthetic total: 6 ml  Complexity: complex Blunt dissection to break up loculations  Drainage: purulent  Drainage amount: 30  Packing material: 1/2 in iodoform gauze  Patient tolerance: Patient tolerated the procedure well with no immediate complications.    Labs Reviewed - No data to display No results found.   No diagnosis found.    MDM    Brenda Valencia is a 42 y.o. female with abscess to right axilla  incised and drained with surprising amount of purulent drainage. Wound packed and patient was instructed to come back for recheck.   Filed Vitals:   07/21/12 1052  BP: 135/93  Pulse: 106  Temp: 98.5 F (36.9 C)  TempSrc: Oral  SpO2: 100%     Pt verbalized understanding and agrees with care plan. Outpatient follow-up and return precautions given.    New Prescriptions   TRAMADOL (ULTRAM) 50 MG TABLET    Take 1 tablet (50 mg total) by mouth every 6 (six) hours as needed for pain.        Wynetta Emery, PA-C 07/21/12 1611

## 2012-07-21 NOTE — ED Provider Notes (Signed)
Medical screening examination/treatment/procedure(s) were performed by non-physician practitioner and as supervising physician I was immediately available for consultation/collaboration. Iva Knapp, MD, FACEP   Iva L Knapp, MD 07/21/12 1615 

## 2012-07-21 NOTE — ED Notes (Signed)
MD at bedside. 

## 2012-07-21 NOTE — ED Notes (Signed)
Pt complains of "boil under my right arm" Pt reports that "it burst last night but I can't take the pain anymore"

## 2012-07-23 ENCOUNTER — Emergency Department (HOSPITAL_COMMUNITY)
Admission: EM | Admit: 2012-07-23 | Discharge: 2012-07-23 | Disposition: A | Discharge status: Home / Self Care | Payer: Self-pay | Attending: Emergency Medicine | Admitting: Emergency Medicine

## 2012-07-23 ENCOUNTER — Encounter (HOSPITAL_COMMUNITY): Payer: Self-pay | Admitting: Emergency Medicine

## 2012-07-23 DIAGNOSIS — Z8619 Personal history of other infectious and parasitic diseases: Secondary | ICD-10-CM | POA: Insufficient documentation

## 2012-07-23 DIAGNOSIS — Z8742 Personal history of other diseases of the female genital tract: Secondary | ICD-10-CM | POA: Insufficient documentation

## 2012-07-23 DIAGNOSIS — Z9889 Other specified postprocedural states: Secondary | ICD-10-CM | POA: Insufficient documentation

## 2012-07-23 DIAGNOSIS — R21 Rash and other nonspecific skin eruption: Secondary | ICD-10-CM | POA: Insufficient documentation

## 2012-07-23 DIAGNOSIS — IMO0002 Reserved for concepts with insufficient information to code with codable children: Secondary | ICD-10-CM | POA: Insufficient documentation

## 2012-07-23 DIAGNOSIS — Z4801 Encounter for change or removal of surgical wound dressing: Secondary | ICD-10-CM | POA: Insufficient documentation

## 2012-07-23 DIAGNOSIS — Z5189 Encounter for other specified aftercare: Secondary | ICD-10-CM

## 2012-07-23 DIAGNOSIS — Z8669 Personal history of other diseases of the nervous system and sense organs: Secondary | ICD-10-CM | POA: Insufficient documentation

## 2012-07-23 MED ORDER — SULFAMETHOXAZOLE-TRIMETHOPRIM 800-160 MG PO TABS: 1.0000 | ORAL_TABLET | Freq: Two times a day (BID) | ORAL | Status: DC

## 2012-07-23 NOTE — ED Provider Notes (Signed)
History     CSN: [id number]  Arrival date & time 07/23/12  0907   First MD Initiated Contact with Patient 07/23/12 [phone]      Chief Complaint  Patient presents with  . Abscess    recheck    (Consider location/radiation/quality/duration/timing/severity/associated sxs/prior treatment) HPI  Brenda Valencia is a 42 y.o. female presenting for wound check and packing removal to right axillary abscess. Patient denies fever, nausea vomiting, redness. She reports that her pain is minimal but the area has been actively draining foul-smelling purulent material.  Past Medical History  Diagnosis Date  . Ectopic pregnancy     Treated with MTX  . Fibroids   . H/O varicella   . Seasonal allergies   . Headache     otc med prn  . Rh negative status during pregnancy 10/19/2011  . Abnormal glucose tolerance in mother complicating pregnancy 10/19/2011    Elevated early 1hr gtt; normal 3hr gtt x2    Past Surgical History  Procedure Laterality Date  . Cesarean section  07/2006    NRFHR  . Axillary nodes       2002  . Wisdom tooth extraction      as teenager  . Cystoscopy  10/19/2011    Procedure: CYSTOSCOPY;  Surgeon: Esmeralda Arthur, MD;  Location: [facility];  Service: Gynecology;  Laterality: N/A;  . Tubal ligation      Family History  Problem Relation Age of Onset  . Hypertension Mother   . Arthritis Mother   . Cancer Mother   . Heart disease Father   . Hypertension Father   . Alcohol abuse Father   . Alcohol abuse Brother     History  Substance Use Topics  . Smoking status: Never Smoker   . Smokeless tobacco: Never Used  . Alcohol Use: No    OB History   Grav Para Term Preterm Abortions TAB SAB Ect Mult Living   3 2 2  1   1  2       Review of Systems  Constitutional: Negative for fever.  Respiratory: Negative for shortness of breath.   Cardiovascular: Negative for chest pain.  Gastrointestinal: Negative for nausea, vomiting, abdominal pain and diarrhea.  Skin:  Positive for rash.  All other systems reviewed and are negative.    Allergies  Penicillins  Home Medications   Current Outpatient Rx  Name  Route  Sig  Dispense  Refill  . traMADol (ULTRAM) 50 MG tablet   Oral   Take 1 tablet (50 mg total) by mouth every 6 (six) hours as needed for pain.   15 tablet   0     BP 132/86  Pulse 96  Temp(Src) 98.6 F (37 C) (Oral)  SpO2 98%  LMP 07/06/2012  Physical Exam  Nursing note and vitals reviewed. Constitutional: She is oriented to person, place, and time. She appears well-developed and well-nourished. No distress.  HENT:  Head: Normocephalic.  Eyes: Conjunctivae and EOM are normal. Pupils are equal, round, and reactive to light.  Cardiovascular: Normal rate.   Pulmonary/Chest: Effort normal. No stridor.  Musculoskeletal: Normal range of motion.  Neurological: She is alert and oriented to person, place, and time.  Skin:  1 cm incision with packing in place actively draining purulent material, abscess is approximately 7 cm in diameter  Psychiatric: She has a normal mood and affect.    ED Course  Procedures (including critical care time)  Packing removed and replaced with 1/2 inch iodoform  gauze approximately 10 cm.  Labs Reviewed - No data to display No results found.   1. Wound check, abscess       MDM   Brenda Valencia is a 42 y.o. female presenting for wound check and packing removal to right axilla there is active purulent drainage. Packing was replaced and patient is started on Bactrim. Return precautions given   Filed Vitals:   07/23/12 0941  BP: 132/86  Pulse: 96  Temp: 98.6 F (37 C)  TempSrc: Oral  SpO2: 98%     Pt verbalized understanding and agrees with care plan. Outpatient follow-up and return precautions given.    Discharge Medication List as of 07/23/2012 11:06 AM    START taking these medications   Details  sulfamethoxazole-trimethoprim (SEPTRA DS) 800-160 MG per tablet Take 1 tablet by  mouth every 12 (twelve) hours., Starting 07/23/2012, Until Discontinued, [employer], PA-C 07/23/12 [phone]

## 2012-07-24 NOTE — ED Provider Notes (Signed)
Medical screening examination/treatment/procedure(s) were performed by non-physician practitioner and as supervising physician I was immediately available for consultation/collaboration.  Douglas Delo, MD 07/24/12 1547 

## 2012-07-26 ENCOUNTER — Emergency Department (HOSPITAL_COMMUNITY)
Admission: EM | Admit: 2012-07-26 | Discharge: 2012-07-26 | Disposition: A | Discharge status: Home / Self Care | Payer: Self-pay | Attending: Emergency Medicine | Admitting: Emergency Medicine

## 2012-07-26 ENCOUNTER — Encounter (HOSPITAL_COMMUNITY): Payer: Self-pay | Admitting: Emergency Medicine

## 2012-07-26 DIAGNOSIS — Z8619 Personal history of other infectious and parasitic diseases: Secondary | ICD-10-CM | POA: Insufficient documentation

## 2012-07-26 DIAGNOSIS — Z4801 Encounter for change or removal of surgical wound dressing: Secondary | ICD-10-CM | POA: Insufficient documentation

## 2012-07-26 DIAGNOSIS — Z5189 Encounter for other specified aftercare: Secondary | ICD-10-CM

## 2012-07-26 DIAGNOSIS — Z8742 Personal history of other diseases of the female genital tract: Secondary | ICD-10-CM | POA: Insufficient documentation

## 2012-07-26 NOTE — ED Provider Notes (Signed)
Medical screening examination/treatment/procedure(s) were performed by non-physician practitioner and as supervising physician I was immediately available for consultation/collaboration.  Gilda Crease, MD 07/26/12 1145

## 2012-07-26 NOTE — ED Provider Notes (Signed)
History     CSN: [id number]  Arrival date & time 07/26/12  1045   First MD Initiated Contact with Patient 07/26/12 1050      No chief complaint on file.   (Consider location/radiation/quality/duration/timing/severity/associated sxs/prior treatment) HPI Comments: Patient presents for packing removal of an abscess that was drained 4 days ago. Patient reports subjective healing and has no complaints at this time. Patient has taken antibiotics as directed. Patient denies associated symptoms.    Past Medical History  Diagnosis Date  . Ectopic pregnancy     Treated with MTX  . Fibroids   . H/O varicella   . Seasonal allergies   . Headache     otc med prn  . Rh negative status during pregnancy 10/19/2011  . Abnormal glucose tolerance in mother complicating pregnancy 10/19/2011    Elevated early 1hr gtt; normal 3hr gtt x2    Past Surgical History  Procedure Laterality Date  . Cesarean section  07/2006    NRFHR  . Axillary nodes       2002  . Wisdom tooth extraction      as teenager  . Cystoscopy  10/19/2011    Procedure: CYSTOSCOPY;  Surgeon: Esmeralda Arthur, MD;  Location: [facility];  Service: Gynecology;  Laterality: N/A;  . Tubal ligation      Family History  Problem Relation Age of Onset  . Hypertension Mother   . Arthritis Mother   . Cancer Mother   . Heart disease Father   . Hypertension Father   . Alcohol abuse Father   . Alcohol abuse Brother     History  Substance Use Topics  . Smoking status: Never Smoker   . Smokeless tobacco: Never Used  . Alcohol Use: No    OB History   Grav Para Term Preterm Abortions TAB SAB Ect Mult Living   3 2 2  1   1  2       Review of Systems  Skin: Positive for wound.  All other systems reviewed and are negative.    Allergies  Penicillins  Home Medications   Current Outpatient Rx  Name  Route  Sig  Dispense  Refill  . sulfamethoxazole-trimethoprim (SEPTRA DS) 800-160 MG per tablet   Oral   Take 1 tablet by mouth  every 12 (twelve) hours.   14 tablet   0   . traMADol (ULTRAM) 50 MG tablet   Oral   Take 1 tablet (50 mg total) by mouth every 6 (six) hours as needed for pain.   15 tablet   0     LMP 07/06/2012  Physical Exam  Nursing note and vitals reviewed. Constitutional: She is oriented to person, place, and time. She appears well-developed and well-nourished. No distress.  HENT:  Head: Normocephalic and atraumatic.  Eyes: Conjunctivae are normal.  Neck: Normal range of motion.  Cardiovascular: Normal rate and regular rhythm.  Exam reveals no gallop and no friction rub.   No murmur heard. Pulmonary/Chest: Effort normal and breath sounds normal. She has no wheezes. She has no rales. She exhibits no tenderness.  Abdominal: Soft. There is no tenderness.  Musculoskeletal: Normal range of motion.  Neurological: She is alert and oriented to person, place, and time. Coordination normal.  Speech is goal-oriented. Moves limbs without ataxia.   Skin: Skin is warm and dry.  Incision and drainage site in right axilla with packing intact.   Psychiatric: She has a normal mood and affect. Her behavior is normal.  ED Course  Procedures (including critical care time)  Labs Reviewed - No data to display No results found.   1. Wound check, abscess       MDM  11:03 AM Packing removed without difficulty. Patient instructed to continue Bactrim until gone and follow up with [facility] as needed. Patient afebrile with stable vitals.         Emilia Beck, PA-C 07/26/12 1130

## 2012-07-26 NOTE — ED Notes (Signed)
Pt reports that she's here for a follow up to have packing removed from abscess under r arm.  Was last seen here Saturday.

## 2013-03-09 ENCOUNTER — Telehealth: Payer: Self-pay

## 2013-03-09 ENCOUNTER — Ambulatory Visit (INDEPENDENT_AMBULATORY_CARE_PROVIDER_SITE_OTHER): Payer: BC Managed Care – PPO | Admitting: Family Medicine

## 2013-03-09 VITALS — BP 140/92 | HR 96 | Temp 98.5°F | Resp 16 | Wt 243.0 lb

## 2013-03-09 DIAGNOSIS — J309 Allergic rhinitis, unspecified: Secondary | ICD-10-CM

## 2013-03-09 DIAGNOSIS — R0982 Postnasal drip: Secondary | ICD-10-CM

## 2013-03-09 DIAGNOSIS — J209 Acute bronchitis, unspecified: Secondary | ICD-10-CM

## 2013-03-09 MED ORDER — AZITHROMYCIN 250 MG PO TABS: ORAL_TABLET | ORAL | Status: DC

## 2013-03-09 MED ORDER — BENZONATATE 200 MG PO CAPS: 200.0000 mg | ORAL_CAPSULE | Freq: Three times a day (TID) | ORAL | Status: DC | PRN

## 2013-03-09 MED ORDER — FLUTICASONE PROPIONATE 50 MCG/ACT NA SUSP: 2.0000 | Freq: Every day | NASAL | Status: DC

## 2013-03-09 MED ORDER — HYDROCOD POLST-CHLORPHEN POLST 10-8 MG/5ML PO LQCR: 5.0000 mL | Freq: Two times a day (BID) | ORAL | Status: DC | PRN

## 2013-03-09 NOTE — Patient Instructions (Signed)
Allergic Rhinitis Allergic rhinitis is when the mucous membranes in the nose respond to allergens. Allergens are particles in the air that cause your body to have an allergic reaction. This causes you to release allergic antibodies. Through a chain of events, these eventually cause you to release histamine into the blood stream (hence the use of antihistamines). Although meant to be protective to the body, it is this release that causes your discomfort, such as frequent sneezing, congestion and an itchy runny nose.  CAUSES  The pollen allergens may come from grasses, trees, and weeds. This is seasonal allergic rhinitis, or "hay fever." Other allergens cause year-round allergic rhinitis (perennial allergic rhinitis) such as house dust mite allergen, pet dander and mold spores.  SYMPTOMS   Nasal stuffiness (congestion).  Runny, itchy nose with sneezing and tearing of the eyes.  There is often an itching of the mouth, eyes and ears. It cannot be cured, but it can be controlled with medications. DIAGNOSIS  If you are unable to determine the offending allergen, skin or blood testing may find it. TREATMENT   Avoid the allergen.  Medications and allergy shots (immunotherapy) can help.  Hay fever may often be treated with antihistamines in pill or nasal spray forms. Antihistamines block the effects of histamine. There are over-the-counter medicines that may help with nasal congestion and swelling around the eyes. Check with your caregiver before taking or giving this medicine. If the treatment above does not work, there are many new medications your caregiver can prescribe. Stronger medications may be used if initial measures are ineffective. Desensitizing injections can be used if medications and avoidance fails. Desensitization is when a patient is given ongoing shots until the body becomes less sensitive to the allergen. Make sure you follow up with your caregiver if problems continue. SEEK MEDICAL  CARE IF:   You develop fever (more than 100.5 F (38.1 C).  You develop a cough that does not stop easily (persistent).  You have shortness of breath.  You start wheezing.  Symptoms interfere with normal daily activities. Document Released: 01/27/2001 Document Revised: 07/27/2011 Document Reviewed: 08/08/2008 [facility] Patient Information 2014 [place], [place]. Acute Bronchitis You have acute bronchitis. This means you have a chest cold. The airways in your lungs are red and sore (inflamed). Acute means it is sudden onset.  CAUSES Bronchitis is most often caused by the same virus that causes a cold. SYMPTOMS   Body aches.  Chest congestion.  Chills.  Cough.  Fever.  Shortness of breath.  Sore throat. TREATMENT  Acute bronchitis is usually treated with rest, fluids, and medicines for relief of fever or cough. Most symptoms should go away after a few days or a week. Increased fluids may help thin your secretions and will prevent dehydration. Your caregiver may give you an inhaler to improve your symptoms. The inhaler reduces shortness of breath and helps control cough. You can take over-the-counter pain relievers or cough medicine to decrease coughing, pain, or fever. A cool-air vaporizer may help thin bronchial secretions and make it easier to clear your chest. Antibiotics are usually not needed but can be prescribed if you smoke, are seriously ill, have chronic lung problems, are elderly, or you are at higher risk for developing complications.Allergies and asthma can make bronchitis worse. Repeated episodes of bronchitis may cause longstanding lung problems. Avoid smoking and secondhand smoke.Exposure to cigarette smoke or irritating chemicals will make bronchitis worse. If you are a cigarette smoker, consider using nicotine gum or skin patches to  help control withdrawal symptoms. Quitting smoking will help your lungs heal faster. Recovery from bronchitis is often slow, but you  should start feeling better after 2 to 3 days. Cough from bronchitis frequently lasts for 3 to 4 weeks. To prevent another bout of acute bronchitis:  Quit smoking.  Wash your hands frequently to get rid of viruses or use a hand sanitizer.  Avoid other people with cold or virus symptoms.  Try not to touch your hands to your mouth, nose, or eyes. SEEK IMMEDIATE MEDICAL CARE IF:  You develop increased fever, chills, or chest pain.  You have severe shortness of breath or bloody sputum.  You develop dehydration, fainting, repeated vomiting, or a severe headache.  You have no improvement after 1 week of treatment or you get worse. MAKE SURE YOU:   Understand these instructions.  Will watch your condition.  Will get help right away if you are not doing well or get worse. Document Released: 06/11/2004 Document Revised: 07/27/2011 Document Reviewed: 08/27/2010 [facility] Patient Information 2014 [place], [place].

## 2013-03-09 NOTE — Telephone Encounter (Signed)
LMOM to CB. 

## 2013-03-09 NOTE — Telephone Encounter (Signed)
Pt stated [facility] didn't have her medication and wanted them sent to [facility] on [address]. I sent the Rx's.

## 2013-03-09 NOTE — Telephone Encounter (Signed)
Patient was seen today and needs her prescriptions sent to [facility] on [address]  [phone]

## 2013-04-06 ENCOUNTER — Ambulatory Visit (INDEPENDENT_AMBULATORY_CARE_PROVIDER_SITE_OTHER): Payer: BC Managed Care – PPO | Admitting: Family Medicine

## 2013-04-06 VITALS — BP 120/78 | HR 94 | Temp 98.7°F | Resp 20 | Ht 66.5 in | Wt 247.2 lb

## 2013-04-06 DIAGNOSIS — R059 Cough, unspecified: Secondary | ICD-10-CM

## 2013-04-06 DIAGNOSIS — J069 Acute upper respiratory infection, unspecified: Secondary | ICD-10-CM

## 2013-04-06 DIAGNOSIS — J209 Acute bronchitis, unspecified: Secondary | ICD-10-CM

## 2013-04-06 DIAGNOSIS — R05 Cough: Secondary | ICD-10-CM

## 2013-04-06 MED ORDER — LEVOFLOXACIN 500 MG PO TABS: 500.0000 mg | ORAL_TABLET | Freq: Every day | ORAL | Status: DC

## 2013-04-06 NOTE — Progress Notes (Addendum)
Subjective:    Patient ID: Brenda Valencia, female    DOB: [birth year], 42 y.o.   MRN: [id number]  This chart was scribed for Meredith Staggers, MD by Greggory Stallion, Medical Scribe. This patient's care was started at 4:05 PM.  HPI HPI Comments: Brenda Valencia is a 42 y.o. female last seen 10/23. Started on Z-pack, tessalon, tussinex, and Flonase nasal spray for possible bronchitis and allergies. Pt states the cough went away after one week but she started having a productive cough of clear sputum 6 days ago. The coughing episodes cause SOB but she denies it any other time. Pt has associated congestion, intermittent chest pains and generalized body aches that started 2 days ago. She states she started taking the cough syrup again at night and taking tessalon during the day with little relief. Pt has also used Flonase once. Denies fever, belching, heartburn, rhinorrhea. She works in a [employer] but denies any other sick contacts. Denies history of heart problems.    Patient Active Problem List   Diagnosis Date Noted  . Rh negative status during pregnancy 10/19/2011  . Obesity 09/29/2011  . Sterilization 09/29/2011  . Breech presentation 09/29/2011  . AMA (advanced maternal age) multigravida 35+ 03/19/2011  . Penicillin allergy 03/19/2011  . History of ectopic pregnancy 03/19/2011  . Fibroids 03/19/2011  . Spotting in early pregnancy 02/23/2011   Past Medical History  Diagnosis Date  . Ectopic pregnancy     Treated with MTX  . Fibroids   . H/O varicella   . Seasonal allergies   . Headache(784.0)     otc med prn  . Rh negative status during pregnancy 10/19/2011  . Abnormal glucose tolerance in mother complicating pregnancy 10/19/2011    Elevated early 1hr gtt; normal 3hr gtt x2   Past Surgical History  Procedure Laterality Date  . Cesarean section  07/2006    NRFHR  . Axillary nodes       2002  . Wisdom tooth extraction      as teenager  . Cystoscopy  10/19/2011   Procedure: CYSTOSCOPY;  Surgeon: Esmeralda Arthur, MD;  Location: [facility];  Service: Gynecology;  Laterality: N/A;  . Tubal ligation     Allergies  Allergen Reactions  . Penicillins Hives and Nausea And Vomiting   Prior to Admission medications   Medication Sig Start Date End Date Taking? Authorizing Provider  benzonatate (TESSALON) 200 MG capsule Take 1 capsule (200 mg total) by mouth 3 (three) times daily as needed for cough. 03/09/13  Yes Sherren Mocha, MD  cetirizine (ZYRTEC) 10 MG tablet Take 10 mg by mouth daily.   Yes Historical Provider, MD  chlorpheniramine-HYDROcodone (TUSSIONEX PENNKINETIC ER) 10-8 MG/5ML LQCR Take 5 mLs by mouth every 12 (twelve) hours as needed (cough). 03/09/13  Yes Sherren Mocha, MD  azithromycin (ZITHROMAX) 250 MG tablet Take 2 tabs PO x 1 dose, then 1 tab PO QD x 4 days 03/09/13   Sherren Mocha, MD  fluticasone [facility]) 50 MCG/ACT nasal spray Place 2 sprays into the nose daily. 03/09/13   Sherren Mocha, MD   History   Social History  . Marital Status: Married    Spouse Name: N/A    Number of Children: N/A  . Years of Education: N/A   Occupational History  . Not on file.   Social History Main Topics  . Smoking status: Never Smoker   . Smokeless tobacco: Never Used  . Alcohol Use: No  .  Drug Use: No  . Sexual Activity: Yes    Birth Control/ Protection: Surgical     Comment: btl   Other Topics Concern  . Not on file   Social History Narrative  . No narrative on file   Review of Systems  Constitutional: Positive for fatigue. Negative for fever.  HENT: Positive for congestion. Negative for rhinorrhea.   Respiratory: Positive for cough and shortness of breath.   Cardiovascular: Positive for chest pain.      Objective:   Physical Exam  Vitals reviewed. Constitutional: She is oriented to person, place, and time. She appears well-developed and well-nourished. No distress.  HENT:  Head: Normocephalic and atraumatic.  Right Ear: Hearing, tympanic  membrane, external ear and ear canal normal.  Left Ear: Hearing, tympanic membrane, external ear and ear canal normal.  Nose: Nose normal.  Mouth/Throat: Oropharynx is clear and moist. No oropharyngeal exudate.  Eyes: Conjunctivae and EOM are normal. Pupils are equal, round, and reactive to light.  Neck: Neck supple.  Cardiovascular: Normal rate, regular rhythm, normal heart sounds and intact distal pulses.   No murmur heard. Pulmonary/Chest: Effort normal and breath sounds normal. No respiratory distress. She has no wheezes. She has no rhonchi.  Neurological: She is alert and oriented to person, place, and time.  Skin: Skin is warm and dry. No rash noted.  Psychiatric: She has a normal mood and affect. Her behavior is normal.   Filed Vitals:   04/06/13 1504  BP: 120/78  Pulse: 94  Temp: 98.7 F (37.1 C)  TempSrc: Oral  Resp: 20  Height: 5' 6.5" (1.689 m)  Weight: 247 lb 3.2 oz (112.129 kg)  SpO2: 100%   No exam data present     Assessment & Plan:   Brenda Valencia is a 42 y.o. female Acute bronchitis - Plan: levofloxacin (LEVAQUIN) 500 MG tablet  Cough - Plan: levofloxacin (LEVAQUIN) 500 MG tablet  Acute upper respiratory infections of unspecified site  Prior bronchitis, with improvement, then return of cough, congestion.  Current sx's suspected URI with cough vs repeat early bronchitis. -sx care with mucinex, saline ns, tessalon if needed and tussionex at night if needed (has rx at home). If not improving in next week can fill Rx for Levaquin. rtc if fever, dyspnea, or worsening. Chest wall pain likely with cough - sx care above,  rtc precautions given.   Meds ordered this encounter  Medications  . levofloxacin (LEVAQUIN) 500 MG tablet    Sig: Take 1 tablet (500 mg total) by mouth daily.    Dispense:  7 tablet    Refill:  0   Patient Instructions  Saline nasal spray atleast 4 times per day, over the counter mucinex or mucinex DM for cough, or tessalon during the  day, tussionex if needed at night to suppress the cough. If not improving in next week - can fill prescription for Levaquin, but Return to the clinic or go to the nearest emergency room if any of your symptoms worsen or new symptoms occur.  Cough, Adult  A cough is a reflex that helps clear your throat and airways. It can help heal the body or may be a reaction to an irritated airway. A cough may only last 2 or 3 weeks (acute) or may last more than 8 weeks (chronic).  CAUSES Acute cough:  Viral or bacterial infections. Chronic cough:  Infections.  Allergies.  Asthma.  Post-nasal drip.  Smoking.  Heartburn or acid reflux.  Some medicines.  Chronic lung problems (COPD).  Cancer. SYMPTOMS   Cough.  Fever.  Chest pain.  Increased breathing rate.  High-pitched whistling sound when breathing (wheezing).  Colored mucus that you cough up (sputum). TREATMENT   A bacterial cough may be treated with antibiotic medicine.  A viral cough must run its course and will not respond to antibiotics.  Your caregiver may recommend other treatments if you have a chronic cough. HOME CARE INSTRUCTIONS   Only take over-the-counter or prescription medicines for pain, discomfort, or fever as directed by your caregiver. Use cough suppressants only as directed by your caregiver.  Use a cold steam vaporizer or humidifier in your bedroom or home to help loosen secretions.  Sleep in a semi-upright position if your cough is worse at night.  Rest as needed.  Stop smoking if you smoke. SEEK IMMEDIATE MEDICAL CARE IF:   You have pus in your sputum.  Your cough starts to worsen.  You cannot control your cough with suppressants and are losing sleep.  You begin coughing up blood.  You have difficulty breathing.  You develop pain which is getting worse or is uncontrolled with medicine.  You have a fever. MAKE SURE YOU:   Understand these instructions.  Will watch your  condition.  Will get help right away if you are not doing well or get worse. Document Released: 10/31/2010 Document Revised: 07/27/2011 Document Reviewed: 10/31/2010 [facility] Patient Information 2014 [place], [place].   Upper Respiratory Infection, Adult An upper respiratory infection (URI) is also sometimes known as the common cold. The upper respiratory tract includes the nose, sinuses, throat, trachea, and bronchi. Bronchi are the airways leading to the lungs. Most people improve within 1 week, but symptoms can last up to 2 weeks. A residual cough may last even longer.  CAUSES Many different viruses can infect the tissues lining the upper respiratory tract. The tissues become irritated and inflamed and often become very moist. Mucus production is also common. A cold is contagious. You can easily spread the virus to others by oral contact. This includes kissing, sharing a glass, coughing, or sneezing. Touching your mouth or nose and then touching a surface, which is then touched by another person, can also spread the virus. SYMPTOMS  Symptoms typically develop 1 to 3 days after you come in contact with a cold virus. Symptoms vary from person to person. They may include:  Runny nose.  Sneezing.  Nasal congestion.  Sinus irritation.  Sore throat.  Loss of voice (laryngitis).  Cough.  Fatigue.  Muscle aches.  Loss of appetite.  Headache.  Low-grade fever. DIAGNOSIS  You might diagnose your own cold based on familiar symptoms, since most people get a cold 2 to 3 times a year. Your caregiver can confirm this based on your exam. Most importantly, your caregiver can check that your symptoms are not due to another disease such as strep throat, sinusitis, pneumonia, asthma, or epiglottitis. Blood tests, throat tests, and X-rays are not necessary to diagnose a common cold, but they may sometimes be helpful in excluding other more serious diseases. Your caregiver will decide if any  further tests are required. RISKS AND COMPLICATIONS  You may be at risk for a more severe case of the common cold if you smoke cigarettes, have chronic heart disease (such as heart failure) or lung disease (such as asthma), or if you have a weakened immune system. The very young and very old are also at risk for more serious infections. Bacterial sinusitis, middle  ear infections, and bacterial pneumonia can complicate the common cold. The common cold can worsen asthma and chronic obstructive pulmonary disease (COPD). Sometimes, these complications can require emergency medical care and may be life-threatening. PREVENTION  The best way to protect against getting a cold is to practice good hygiene. Avoid oral or hand contact with people with cold symptoms. Wash your hands often if contact occurs. There is no clear evidence that vitamin C, vitamin E, echinacea, or exercise reduces the chance of developing a cold. However, it is always recommended to get plenty of rest and practice good nutrition. TREATMENT  Treatment is directed at relieving symptoms. There is no cure. Antibiotics are not effective, because the infection is caused by a virus, not by bacteria. Treatment may include:  Increased fluid intake. Sports drinks offer valuable electrolytes, sugars, and fluids.  Breathing heated mist or steam (vaporizer or shower).  Eating chicken soup or other clear broths, and maintaining good nutrition.  Getting plenty of rest.  Using gargles or lozenges for comfort.  Controlling fevers with ibuprofen or acetaminophen as directed by your caregiver.  Increasing usage of your inhaler if you have asthma. Zinc gel and zinc lozenges, taken in the first 24 hours of the common cold, can shorten the duration and lessen the severity of symptoms. Pain medicines may help with fever, muscle aches, and throat pain. A variety of non-prescription medicines are available to treat congestion and runny nose. Your caregiver  can make recommendations and may suggest nasal or lung inhalers for other symptoms.  HOME CARE INSTRUCTIONS   Only take over-the-counter or prescription medicines for pain, discomfort, or fever as directed by your caregiver.  Use a warm mist humidifier or inhale steam from a shower to increase air moisture. This may keep secretions moist and make it easier to breathe.  Drink enough water and fluids to keep your urine clear or pale yellow.  Rest as needed.  Return to work when your temperature has returned to normal or as your caregiver advises. You may need to stay home longer to avoid infecting others. You can also use a face mask and careful hand washing to prevent spread of the virus. SEEK MEDICAL CARE IF:   After the first few days, you feel you are getting worse rather than better.  You need your caregiver's advice about medicines to control symptoms.  You develop chills, worsening shortness of breath, or brown or red sputum. These may be signs of pneumonia.  You develop yellow or brown nasal discharge or pain in the face, especially when you bend forward. These may be signs of sinusitis.  You develop a fever, swollen neck glands, pain with swallowing, or white areas in the back of your throat. These may be signs of strep throat. SEEK IMMEDIATE MEDICAL CARE IF:   You have a fever.  You develop severe or persistent headache, ear pain, sinus pain, or chest pain.  You develop wheezing, a prolonged cough, cough up blood, or have a change in your usual mucus (if you have chronic lung disease).  You develop sore muscles or a stiff neck. Document Released: 10/28/2000 Document Revised: 07/27/2011 Document Reviewed: 09/05/2010 [facility] Patient Information 2014 [place], [place].   I personally performed the services described in this documentation, which was scribed in my presence. The recorded information has been reviewed and considered, and addended by me as needed.

## 2013-04-06 NOTE — Patient Instructions (Signed)
Saline nasal spray atleast 4 times per day, over the counter mucinex or mucinex DM for cough, or tessalon during the day, tussionex if needed at night to suppress the cough. If not improving in next week - can fill prescription for Levaquin, but Return to the clinic or go to the nearest emergency room if any of your symptoms worsen or new symptoms occur.  Cough, Adult  A cough is a reflex that helps clear your throat and airways. It can help heal the body or may be a reaction to an irritated airway. A cough may only last 2 or 3 weeks (acute) or may last more than 8 weeks (chronic).  CAUSES Acute cough:  Viral or bacterial infections. Chronic cough:  Infections.  Allergies.  Asthma.  Post-nasal drip.  Smoking.  Heartburn or acid reflux.  Some medicines.  Chronic lung problems (COPD).  Cancer. SYMPTOMS   Cough.  Fever.  Chest pain.  Increased breathing rate.  High-pitched whistling sound when breathing (wheezing).  Colored mucus that you cough up (sputum). TREATMENT   A bacterial cough may be treated with antibiotic medicine.  A viral cough must run its course and will not respond to antibiotics.  Your caregiver may recommend other treatments if you have a chronic cough. HOME CARE INSTRUCTIONS   Only take over-the-counter or prescription medicines for pain, discomfort, or fever as directed by your caregiver. Use cough suppressants only as directed by your caregiver.  Use a cold steam vaporizer or humidifier in your bedroom or home to help loosen secretions.  Sleep in a semi-upright position if your cough is worse at night.  Rest as needed.  Stop smoking if you smoke. SEEK IMMEDIATE MEDICAL CARE IF:   You have pus in your sputum.  Your cough starts to worsen.  You cannot control your cough with suppressants and are losing sleep.  You begin coughing up blood.  You have difficulty breathing.  You develop pain which is getting worse or is uncontrolled  with medicine.  You have a fever. MAKE SURE YOU:   Understand these instructions.  Will watch your condition.  Will get help right away if you are not doing well or get worse. Document Released: 10/31/2010 Document Revised: 07/27/2011 Document Reviewed: 10/31/2010 [facility] Patient Information 2014 [place], [place].   Upper Respiratory Infection, Adult An upper respiratory infection (URI) is also sometimes known as the common cold. The upper respiratory tract includes the nose, sinuses, throat, trachea, and bronchi. Bronchi are the airways leading to the lungs. Most people improve within 1 week, but symptoms can last up to 2 weeks. A residual cough may last even longer.  CAUSES Many different viruses can infect the tissues lining the upper respiratory tract. The tissues become irritated and inflamed and often become very moist. Mucus production is also common. A cold is contagious. You can easily spread the virus to others by oral contact. This includes kissing, sharing a glass, coughing, or sneezing. Touching your mouth or nose and then touching a surface, which is then touched by another person, can also spread the virus. SYMPTOMS  Symptoms typically develop 1 to 3 days after you come in contact with a cold virus. Symptoms vary from person to person. They may include:  Runny nose.  Sneezing.  Nasal congestion.  Sinus irritation.  Sore throat.  Loss of voice (laryngitis).  Cough.  Fatigue.  Muscle aches.  Loss of appetite.  Headache.  Low-grade fever. DIAGNOSIS  You might diagnose your own cold based on  familiar symptoms, since most people get a cold 2 to 3 times a year. Your caregiver can confirm this based on your exam. Most importantly, your caregiver can check that your symptoms are not due to another disease such as strep throat, sinusitis, pneumonia, asthma, or epiglottitis. Blood tests, throat tests, and X-rays are not necessary to diagnose a common cold, but they  may sometimes be helpful in excluding other more serious diseases. Your caregiver will decide if any further tests are required. RISKS AND COMPLICATIONS  You may be at risk for a more severe case of the common cold if you smoke cigarettes, have chronic heart disease (such as heart failure) or lung disease (such as asthma), or if you have a weakened immune system. The very young and very old are also at risk for more serious infections. Bacterial sinusitis, middle ear infections, and bacterial pneumonia can complicate the common cold. The common cold can worsen asthma and chronic obstructive pulmonary disease (COPD). Sometimes, these complications can require emergency medical care and may be life-threatening. PREVENTION  The best way to protect against getting a cold is to practice good hygiene. Avoid oral or hand contact with people with cold symptoms. Wash your hands often if contact occurs. There is no clear evidence that vitamin C, vitamin E, echinacea, or exercise reduces the chance of developing a cold. However, it is always recommended to get plenty of rest and practice good nutrition. TREATMENT  Treatment is directed at relieving symptoms. There is no cure. Antibiotics are not effective, because the infection is caused by a virus, not by bacteria. Treatment may include:  Increased fluid intake. Sports drinks offer valuable electrolytes, sugars, and fluids.  Breathing heated mist or steam (vaporizer or shower).  Eating chicken soup or other clear broths, and maintaining good nutrition.  Getting plenty of rest.  Using gargles or lozenges for comfort.  Controlling fevers with ibuprofen or acetaminophen as directed by your caregiver.  Increasing usage of your inhaler if you have asthma. Zinc gel and zinc lozenges, taken in the first 24 hours of the common cold, can shorten the duration and lessen the severity of symptoms. Pain medicines may help with fever, muscle aches, and throat pain. A  variety of non-prescription medicines are available to treat congestion and runny nose. Your caregiver can make recommendations and may suggest nasal or lung inhalers for other symptoms.  HOME CARE INSTRUCTIONS   Only take over-the-counter or prescription medicines for pain, discomfort, or fever as directed by your caregiver.  Use a warm mist humidifier or inhale steam from a shower to increase air moisture. This may keep secretions moist and make it easier to breathe.  Drink enough water and fluids to keep your urine clear or pale yellow.  Rest as needed.  Return to work when your temperature has returned to normal or as your caregiver advises. You may need to stay home longer to avoid infecting others. You can also use a face mask and careful hand washing to prevent spread of the virus. SEEK MEDICAL CARE IF:   After the first few days, you feel you are getting worse rather than better.  You need your caregiver's advice about medicines to control symptoms.  You develop chills, worsening shortness of breath, or brown or red sputum. These may be signs of pneumonia.  You develop yellow or brown nasal discharge or pain in the face, especially when you bend forward. These may be signs of sinusitis.  You develop a fever, swollen neck  glands, pain with swallowing, or white areas in the back of your throat. These may be signs of strep throat. SEEK IMMEDIATE MEDICAL CARE IF:   You have a fever.  You develop severe or persistent headache, ear pain, sinus pain, or chest pain.  You develop wheezing, a prolonged cough, cough up blood, or have a change in your usual mucus (if you have chronic lung disease).  You develop sore muscles or a stiff neck. Document Released: 10/28/2000 Document Revised: 07/27/2011 Document Reviewed: 09/05/2010 [facility] Patient Information 2014 [place], [place].

## 2013-05-17 ENCOUNTER — Ambulatory Visit: Payer: BC Managed Care – PPO

## 2013-05-17 ENCOUNTER — Ambulatory Visit (INDEPENDENT_AMBULATORY_CARE_PROVIDER_SITE_OTHER): Payer: BC Managed Care – PPO | Admitting: Family Medicine

## 2013-05-17 VITALS — BP 126/82 | HR 92 | Temp 98.7°F | Resp 18 | Ht 65.5 in | Wt 246.0 lb

## 2013-05-17 DIAGNOSIS — J329 Chronic sinusitis, unspecified: Secondary | ICD-10-CM

## 2013-05-17 DIAGNOSIS — R059 Cough, unspecified: Secondary | ICD-10-CM

## 2013-05-17 DIAGNOSIS — R05 Cough: Secondary | ICD-10-CM

## 2013-05-17 DIAGNOSIS — J322 Chronic ethmoidal sinusitis: Secondary | ICD-10-CM

## 2013-05-17 MED ORDER — HYDROCODONE-HOMATROPINE 5-1.5 MG/5ML PO SYRP: 5.0000 mL | ORAL_SOLUTION | ORAL | Status: DC | PRN

## 2013-05-17 MED ORDER — CEFDINIR 300 MG PO CAPS: ORAL_CAPSULE | ORAL | Status: DC

## 2013-05-17 MED ORDER — PREDNISONE 20 MG PO TABS: ORAL_TABLET | ORAL | Status: DC

## 2013-05-17 MED ORDER — CETIRIZINE HCL 10 MG PO TABS: 10.0000 mg | ORAL_TABLET | Freq: Every day | ORAL | Status: DC

## 2013-05-17 NOTE — Progress Notes (Signed)
Subjective: Patient has had a sinus problem for couple of months. He did better back in November, then flared up again in early December and persist. She has been coughing a lot. She now has left maxillary area pressure.  Objective: TMs normal. Throat clear. Neck supple without nodes. Chest clear. She is a little bit tender in the left maxillary and frontal sinus areas.  [facility] reading (PRIMARY) by  Dr. Alwyn Ren Left maxillary sinusitis Normal chest xray  Assessment: Left maxillary sinusitis Cough  Plan: Omnicef for 20 days Return if 10 days if not improved Fluticasone Prednisone Hydrocodone cough syrup .

## 2013-05-17 NOTE — Patient Instructions (Addendum)
Drink plenty of fluids to help keep secretions thin  Take the prednisone 3 pills daily for 2 days, then 2 pills daily for 2 days, then one pill daily for 2 days 4 the sinuses  Use the fluticasone nose spray 2 sprays each nostril twice daily for 3 days, then decrease to once daily  Take the Omnicef 30 mg twice daily for antibiotic. After the initial 10 days get refilled for another 10 days if doing well with it.  If not much better even by the 10 day mark please come back for recheck.  Take hydrocodone cough syrup 1 teaspoon every 4-6 hours as needed for cough

## 2013-05-27 ENCOUNTER — Other Ambulatory Visit: Payer: Self-pay | Admitting: Family Medicine

## 2013-05-31 ENCOUNTER — Telehealth: Payer: Self-pay

## 2013-05-31 DIAGNOSIS — J329 Chronic sinusitis, unspecified: Secondary | ICD-10-CM

## 2013-05-31 NOTE — Telephone Encounter (Signed)
Patient was prescribed 20 days of antibiotics.  Only picked up ten days worth.  Wants to know if she needs the other ten days.   [phone]

## 2013-06-01 MED ORDER — CEFDINIR 300 MG PO CAPS: ORAL_CAPSULE | ORAL | Status: DC

## 2013-06-01 NOTE — Telephone Encounter (Signed)
LM advise pt that another rx has been sent. Looks like we sent two in the same day.

## 2013-06-06 ENCOUNTER — Ambulatory Visit (INDEPENDENT_AMBULATORY_CARE_PROVIDER_SITE_OTHER): Payer: BC Managed Care – PPO | Admitting: Emergency Medicine

## 2013-06-06 VITALS — BP 122/76 | HR 90 | Temp 98.7°F | Resp 16 | Ht 66.0 in | Wt 241.0 lb

## 2013-06-06 DIAGNOSIS — H1030 Unspecified acute conjunctivitis, unspecified eye: Secondary | ICD-10-CM

## 2013-06-06 MED ORDER — TOBRAMYCIN 0.3 % OP SOLN: 2.0000 [drp] | OPHTHALMIC | Status: DC

## 2013-06-06 NOTE — Patient Instructions (Signed)

## 2013-06-06 NOTE — Progress Notes (Signed)
[facility] [address] [place] [address] [phone]  Date:  06/06/2013   Name:  Brenda Valencia   DOB:  01-15-1971   MRN:  [id number]  PCP:  Alwyn Pea, MD    Chief Complaint: Conjunctivitis, Cough and Sore Throat   History of Present Illness:  Brenda Valencia is a 43 y.o. very pleasant female patient who presents with the following:  Says she has taken three rounds of antibiotic for a cough since early November.  Says cough is not meaningfully improved as she has resumed coughing Thursday associated with hoarseness.  Now has crusting and drainage from her right eye child at home with conjunctivitis.  No fever or chills. No wheezing.  Some exertional shortness of breath.  No nausea or vomiting.  No improvement with over the counter medications or other home remedies. Denies other complaint or health concern today.   Patient Active Problem List   Diagnosis Date Noted  . Rh negative status during pregnancy 10/19/2011  . Obesity 09/29/2011  . Sterilization 09/29/2011  . Breech presentation 09/29/2011  . AMA (advanced maternal age) multigravida 35+ 03/19/2011  . Penicillin allergy 03/19/2011  . History of ectopic pregnancy 03/19/2011  . Fibroids 03/19/2011  . Spotting in early pregnancy 02/23/2011    Past Medical History  Diagnosis Date  . Ectopic pregnancy     Treated with MTX  . Fibroids   . H/O varicella   . Seasonal allergies   . Headache(784.0)     otc med prn  . Rh negative status during pregnancy 10/19/2011  . Abnormal glucose tolerance in mother complicating pregnancy 01/19/8545    Elevated early 1hr gtt; normal 3hr gtt x2    Past Surgical History  Procedure Laterality Date  . Cesarean section  07/2006    NRFHR  . Axillary nodes       2002  . Wisdom tooth extraction      as teenager  . Cystoscopy  10/19/2011    Procedure: CYSTOSCOPY;  Surgeon: Alwyn Pea, MD;  Location: [facility];  Service: Gynecology;  Laterality: N/A;   . Tubal ligation      History  Substance Use Topics  . Smoking status: Never Smoker   . Smokeless tobacco: Never Used  . Alcohol Use: No    Family History  Problem Relation Age of Onset  . Hypertension Mother   . Arthritis Mother   . Cancer Mother   . Heart disease Father   . Hypertension Father   . Alcohol abuse Father   . Alcohol abuse Brother     Allergies  Allergen Reactions  . Penicillins Hives and Nausea And Vomiting    Medication list has been reviewed and updated.  Current Outpatient Prescriptions on File Prior to Visit  Medication Sig Dispense Refill  . cefdinir (OMNICEF) 300 MG capsule Take one tablet twice daily for sinusitis  20 capsule  0  . cetirizine (ZYRTEC) 10 MG tablet Take 1 tablet (10 mg total) by mouth daily.  20 tablet  0  . fluticasone (FLONASE) 50 MCG/ACT nasal spray Place 2 sprays into the nose daily.  16 g  6  . HYDROcodone-homatropine (HYCODAN) 5-1.5 MG/5ML syrup Take 5 mLs by mouth every 4 (four) hours as needed for cough.  120 mL  0   No current facility-administered medications on file prior to visit.    Review of Systems:  As per HPI, otherwise negative.    Physical Examination: Filed Vitals:  06/06/13 0856  BP: 122/76  Pulse: 90  Temp: 98.7 F (37.1 C)  Resp: 16   Filed Vitals:   06/06/13 0856  Height: 5\' 6"  (1.676 m)  Weight: 241 lb (109.317 kg)   Body mass index is 38.92 kg/(m^2). Ideal Body Weight: Weight in (lb) to have BMI = 25: 154.6   GEN: WDWN, NAD, Non-toxic, Alert & Oriented x 3 HEENT: Atraumatic, Normocephalic.  Ears and Nose: No external deformity. EXTR: No clubbing/cyanosis/edema NEURO: Normal gait.  PSYCH: Normally interactive. Conversant. Not depressed or anxious appearing.  Calm demeanor.  RIGHT EYE:  Conjunctivitis with lid exudate  Assessment and Plan: Conjunctivitis tobrex   Signed,  Ellison Carwin, MD

## 2013-06-18 NOTE — Progress Notes (Signed)
Subjective:    Patient ID: Brenda Valencia, female    DOB: 03-22-1971, 43 y.o.   MRN: [id number]  This chart was scribed for Delman Cheadle, MD by Rolanda Lundborg, ED Scribe. This patient was seen in room 13 and the patient's care was started at 2:53 PM.  Chief Complaint  Patient presents with  . Cough    * 1 month- using OTC cough syrup   Cough Associated symptoms include postnasal drip, rhinorrhea, a sore throat and shortness of breath. Pertinent negatives include no chest pain, chills, fever, headaches or rash.   HPI Comments: Brenda Valencia is a 43 y.o. female who [facility] complaining of constant productive cough with clear, foamy sputum for the past month. She states it started by itself, not with an illness. She thought it was allergies but it has not gone away. She reports episodes of hacking where she experiences SOB. The cough is not worse during the day or night, but is constant throughout. She has tried Robitussin with mild relief. She has been taking her allergy medication every day. She reports occasional nasal congestion, postnasal drip. She has been staying well-hydrated. She felt her ears pop yesterday for the first time. She started getting a sore throat last week. She denies fevers, chills, diaphoresis. She denies indigestion, reflux, heartburn. She denies metallic taste in her mouth. She denies h/o asthma. She has never been a smoker.  Past Medical History  Diagnosis Date  . Ectopic pregnancy     Treated with MTX  . Fibroids   . H/O varicella   . Seasonal allergies   . Headache(784.0)     otc med prn  . Rh negative status during pregnancy 10/19/2011  . Abnormal glucose tolerance in mother complicating pregnancy 06/21/5359    Elevated early 1hr gtt; normal 3hr gtt x2   No current outpatient prescriptions on file prior to visit.   No current facility-administered medications on file prior to visit.   Allergies  Allergen  Reactions  . Penicillins Hives and Nausea And Vomiting    Review of Systems  Constitutional: Negative for fever, chills and diaphoresis.  HENT: Positive for congestion, postnasal drip, rhinorrhea and sore throat.   Eyes: Negative for visual disturbance.  Respiratory: Positive for cough and shortness of breath.   Cardiovascular: Negative for chest pain and leg swelling.  Gastrointestinal: Negative for nausea, vomiting, abdominal pain and diarrhea.  Genitourinary: Negative for dysuria.  Musculoskeletal: Negative for back pain and neck pain.  Skin: Negative for rash.  Neurological: Negative for headaches.  Hematological: Does not bruise/bleed easily.  Psychiatric/Behavioral: Negative for confusion.  All other systems reviewed and are negative.      BP 140/92  Pulse 96  Temp(Src) 98.5 F (36.9 C) (Oral)  Resp 16  Wt 243 lb (110.224 kg)  SpO2 99%  LMP 03/08/2013  Breastfeeding? Yes Objective:   Physical Exam  Constitutional: She is oriented to person, place, and time. She appears well-developed and well-nourished. No distress.  HENT:  Head: Normocephalic and atraumatic.  Right Ear: Tympanic membrane, external ear and ear canal normal.  Left Ear: Tympanic membrane, external ear and ear canal normal.  Nose: Mucosal edema and rhinorrhea present. Right sinus exhibits no maxillary sinus tenderness. Left sinus exhibits no maxillary sinus tenderness.  Mouth/Throat: Uvula is midline and mucous membranes are normal. No oropharyngeal exudate, posterior oropharyngeal edema or posterior oropharyngeal erythema.  Eyes: Conjunctivae are normal. Right eye exhibits no discharge. Left eye  exhibits no discharge. No scleral icterus.  Neck: Normal range of motion. Neck supple. No mass and no thyromegaly present.  Cardiovascular: Normal rate, regular rhythm, S1 normal, S2 normal, normal heart sounds and intact distal pulses.   No murmur heard. Pulmonary/Chest: Effort normal and breath sounds normal.  Not tachypneic. No respiratory distress. She has no decreased breath sounds. She has no wheezes. She has no rhonchi. She has no rales.  Did not cough at all during exam.  Lymphadenopathy:    She has no cervical adenopathy.  Neurological: She is alert and oriented to person, place, and time.  Skin: Skin is warm and dry. She is not diaphoretic. No erythema.  Psychiatric: She has a normal mood and affect. Her behavior is normal.   Peak flow: Predicted 480    Assessment & Plan:   Acute bronchitis  Postnasal drip  Allergic rhinitis  Meds ordered this encounter  Medications  . cetirizine (ZYRTEC) 10 MG tablet    Sig: Take 10 mg by mouth daily.  Marland Kitchen azithromycin (ZITHROMAX) 250 MG tablet    Sig: Take 2 tabs PO x 1 dose, then 1 tab PO QD x 4 days    Dispense:  6 tablet    Refill:  0  . benzonatate (TESSALON) 200 MG capsule    Sig: Take 1 capsule (200 mg total) by mouth 3 (three) times daily as needed for cough.    Dispense:  30 capsule    Refill:  0  . chlorpheniramine-HYDROcodone (TUSSIONEX PENNKINETIC ER) 10-8 MG/5ML LQCR    Sig: Take 5 mLs by mouth every 12 (twelve) hours as needed (cough).    Dispense:  140 mL    Refill:  0  . fluticasone (FLONASE) 50 MCG/ACT nasal spray    Sig: Place 2 sprays into the nose daily.    Dispense:  16 g    Refill:  6    I personally performed the services described in this documentation, which was scribed in my presence. The recorded information has been reviewed and considered, and addended by me as needed.  Delman Cheadle, MD MPH

## 2014-05-18 DIAGNOSIS — N920 Excessive and frequent menstruation with regular cycle: Secondary | ICD-10-CM | POA: Insufficient documentation

## 2014-07-26 ENCOUNTER — Other Ambulatory Visit: Payer: Self-pay | Admitting: Obstetrics and Gynecology

## 2014-07-26 DIAGNOSIS — Z1231 Encounter for screening mammogram for malignant neoplasm of breast: Secondary | ICD-10-CM

## 2014-08-09 ENCOUNTER — Ambulatory Visit
Admission: RE | Admit: 2014-08-09 | Discharge: 2014-08-09 | Disposition: A | Discharge status: Home / Self Care | Payer: BLUE CROSS/BLUE SHIELD | Source: Ambulatory Visit | Attending: Obstetrics and Gynecology | Admitting: Obstetrics and Gynecology

## 2014-08-09 ENCOUNTER — Other Ambulatory Visit: Payer: Self-pay | Admitting: Obstetrics and Gynecology

## 2014-08-09 DIAGNOSIS — Z1231 Encounter for screening mammogram for malignant neoplasm of breast: Secondary | ICD-10-CM

## 2014-08-10 ENCOUNTER — Other Ambulatory Visit: Payer: Self-pay | Admitting: Obstetrics and Gynecology

## 2014-08-10 DIAGNOSIS — R928 Other abnormal and inconclusive findings on diagnostic imaging of breast: Secondary | ICD-10-CM

## 2014-08-16 ENCOUNTER — Ambulatory Visit
Admission: RE | Admit: 2014-08-16 | Discharge: 2014-08-16 | Disposition: A | Discharge status: Home / Self Care | Payer: BLUE CROSS/BLUE SHIELD | Source: Ambulatory Visit | Attending: Obstetrics and Gynecology | Admitting: Obstetrics and Gynecology

## 2014-08-16 DIAGNOSIS — R928 Other abnormal and inconclusive findings on diagnostic imaging of breast: Secondary | ICD-10-CM

## 2014-08-21 ENCOUNTER — Other Ambulatory Visit: Payer: Self-pay | Admitting: Obstetrics and Gynecology

## 2016-04-02 ENCOUNTER — Other Ambulatory Visit: Payer: Self-pay | Admitting: Obstetrics and Gynecology

## 2016-04-02 DIAGNOSIS — Z1231 Encounter for screening mammogram for malignant neoplasm of breast: Secondary | ICD-10-CM

## 2016-05-14 ENCOUNTER — Ambulatory Visit
Admission: RE | Admit: 2016-05-14 | Discharge: 2016-05-14 | Disposition: A | Discharge status: Home / Self Care | Payer: BLUE CROSS/BLUE SHIELD | Source: Ambulatory Visit | Attending: Obstetrics and Gynecology | Admitting: Obstetrics and Gynecology

## 2016-05-14 DIAGNOSIS — Z1231 Encounter for screening mammogram for malignant neoplasm of breast: Secondary | ICD-10-CM | POA: Diagnosis not present

## 2016-09-07 DIAGNOSIS — L02411 Cutaneous abscess of right axilla: Secondary | ICD-10-CM | POA: Diagnosis not present

## 2016-09-14 DIAGNOSIS — L732 Hidradenitis suppurativa: Secondary | ICD-10-CM | POA: Diagnosis not present

## 2016-11-05 ENCOUNTER — Encounter: Payer: Self-pay | Admitting: Podiatry

## 2016-11-05 ENCOUNTER — Ambulatory Visit (INDEPENDENT_AMBULATORY_CARE_PROVIDER_SITE_OTHER): Payer: BLUE CROSS/BLUE SHIELD | Admitting: Podiatry

## 2016-11-05 ENCOUNTER — Ambulatory Visit (INDEPENDENT_AMBULATORY_CARE_PROVIDER_SITE_OTHER): Payer: BLUE CROSS/BLUE SHIELD

## 2016-11-05 VITALS — BP 147/79 | HR 90 | Resp 16

## 2016-11-05 DIAGNOSIS — M779 Enthesopathy, unspecified: Secondary | ICD-10-CM

## 2016-11-05 DIAGNOSIS — M722 Plantar fascial fibromatosis: Secondary | ICD-10-CM

## 2016-11-05 DIAGNOSIS — M7752 Other enthesopathy of left foot: Secondary | ICD-10-CM

## 2016-11-05 DIAGNOSIS — M778 Other enthesopathies, not elsewhere classified: Secondary | ICD-10-CM

## 2016-11-05 NOTE — Progress Notes (Signed)
   Subjective:    Patient ID: Brenda Valencia, female    DOB: 1970-12-11, 46 y.o.   MRN: [id number]  HPI: She presents today with a chief complaint of pain to the dorsal and dorsolateral aspect of left ankle she states that sometimes his anterior pain as well. She denies any trauma other than 30 years ago. She states it is than aching and swelling recently over the past several months she denies any recent injury to his she states she wears a brace occasionally but it really hurts wish with inversion and eversion of the foot and ankle. She's tried Advil and it did not help. 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    Review of Systems  Musculoskeletal: Positive for gait problem.  All other systems reviewed and are negative.      Objective:   Physical Exam: Vital signs are stable she is alert and oriented 3 in no apparent distress. Very pleasant easy to talk to. Pulses are strongly palpable. Neurologic sensorium is intact. Deep tendon reflexes are intact. Muscle strength +5 over 5 dorsiflexion plantar flexors and inverters everters all intrinsic musculature is intact. Orthopedic evaluation demonstrates a prominence appears to be bony nonpulsatile in nature to the anterolateral aspect of the talus she also has pain on inversion and eversion of the subtalar joint. 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 The remainder of the subtalar joint appears to be in pretty decent shape.        Assessment & Plan:  Assessment: Subtalar joint capsulitis left exostosis of the talus left.  Plan: At this point I gave the patient an option of an injection to the subtalar joint today of Kenalog and local anesthetic or obtaining a CT scan of the left ankle. She chose the injection. At this point we inject the subtalar joint today with long  local anesthetic after Betadine skin prep. Should this not alleviate her symptoms are rendered her asymptomatic than a CT scan will be necessary for surgical consideration.

## 2016-12-03 ENCOUNTER — Encounter: Payer: Self-pay | Admitting: Podiatry

## 2016-12-03 ENCOUNTER — Ambulatory Visit (INDEPENDENT_AMBULATORY_CARE_PROVIDER_SITE_OTHER): Payer: BLUE CROSS/BLUE SHIELD | Admitting: Podiatry

## 2016-12-03 DIAGNOSIS — M779 Enthesopathy, unspecified: Principal | ICD-10-CM

## 2016-12-03 DIAGNOSIS — M7752 Other enthesopathy of left foot: Secondary | ICD-10-CM | POA: Diagnosis not present

## 2016-12-03 DIAGNOSIS — M778 Other enthesopathies, not elsewhere classified: Secondary | ICD-10-CM

## 2016-12-03 NOTE — Progress Notes (Signed)
She presents as a follow-up of capsulitis of the left foot she states that it took about a week or so for start feeling better than slipped and twisted the foot, hitting a car door. She states this started hurting again it is not as that is was previously.  Objective: Vital signs are stable she is alert and oriented 3. Pulses are palpable. She is pain on palpation of the sinus tarsi and on an range of motion of the subtalar joint left foot.  Assessment: Capsulitis subtalar joint left sinus tarsitis left.  Plan: Injected the left foot today at the sinus tarsi after sterile Betadine skin prep with Kenalog and local anesthetic. Follow up with her on an as-needed basis.

## 2017-06-24 DIAGNOSIS — J029 Acute pharyngitis, unspecified: Secondary | ICD-10-CM | POA: Diagnosis not present

## 2017-06-24 DIAGNOSIS — R05 Cough: Secondary | ICD-10-CM | POA: Diagnosis not present

## 2017-06-29 DIAGNOSIS — J029 Acute pharyngitis, unspecified: Secondary | ICD-10-CM | POA: Diagnosis not present

## 2017-11-25 DIAGNOSIS — R030 Elevated blood-pressure reading, without diagnosis of hypertension: Secondary | ICD-10-CM | POA: Diagnosis not present

## 2017-11-25 DIAGNOSIS — R7309 Other abnormal glucose: Secondary | ICD-10-CM | POA: Diagnosis not present

## 2017-11-25 DIAGNOSIS — R002 Palpitations: Secondary | ICD-10-CM | POA: Diagnosis not present

## 2017-11-25 DIAGNOSIS — E559 Vitamin D deficiency, unspecified: Secondary | ICD-10-CM | POA: Diagnosis not present

## 2017-11-25 DIAGNOSIS — M62838 Other muscle spasm: Secondary | ICD-10-CM | POA: Diagnosis not present

## 2017-11-25 DIAGNOSIS — R5383 Other fatigue: Secondary | ICD-10-CM | POA: Diagnosis not present

## 2017-11-25 LAB — LIPID PANEL
CHOLESTEROL: 148 (ref 0–200)
HDL: 38 (ref 35–70)
LDL Cholesterol: 82
TRIGLYCERIDES: 142 (ref 40–160)

## 2017-11-25 LAB — BASIC METABOLIC PANEL
BUN: 8 (ref 4–21)
Creatinine: 0.8 (ref 0.5–1.1)
Glucose: 86
POTASSIUM: 4.8 (ref 3.4–5.3)
Sodium: 139 (ref 137–147)

## 2017-11-25 LAB — CBC AND DIFFERENTIAL
HEMATOCRIT: 29 — AB (ref 36–46)
HEMOGLOBIN: 8.1 — AB (ref 12.0–16.0)
Platelets: 342 (ref 150–399)
WBC: 4.6

## 2017-11-25 LAB — IRON,TIBC AND FERRITIN PANEL
Ferritin: 4
IRON: 15
TIBC: 406
UIBC: 391

## 2017-11-25 LAB — TSH: TSH: 1.48 (ref 0.41–5.90)

## 2017-11-25 LAB — HEPATIC FUNCTION PANEL
ALK PHOS: 77 (ref 25–125)
ALT: 10 (ref 7–35)
AST: 14 (ref 13–35)
BILIRUBIN, TOTAL: 0.2

## 2017-11-25 LAB — VITAMIN D 25 HYDROXY (VIT D DEFICIENCY, FRACTURES): Vit D, 25-Hydroxy: 27.9

## 2017-11-25 LAB — HEMOGLOBIN A1C: Hemoglobin A1C: 5.9

## 2018-01-27 ENCOUNTER — Encounter: Payer: Self-pay | Admitting: Podiatry

## 2018-01-27 ENCOUNTER — Ambulatory Visit: Payer: BLUE CROSS/BLUE SHIELD | Admitting: Podiatry

## 2018-01-27 DIAGNOSIS — M7752 Other enthesopathy of left foot: Secondary | ICD-10-CM

## 2018-01-27 DIAGNOSIS — M779 Enthesopathy, unspecified: Secondary | ICD-10-CM | POA: Diagnosis not present

## 2018-01-27 NOTE — Progress Notes (Signed)
She presents today stating that the left foot is hurting again for the past 2 months.  Objective: Vital signs are stable she is alert and oriented x3.  Pulses are palpable.  She has pain on palpation of the sinus tarsi and on end range of motion of the subtalar joint left foot.  Mild edema overlying the anterolateral ankle.  She has no pain on palpation of the peroneal tendons.  Assessment: Subtalar joint capsulitis osteoarthritis left foot.  Plan: I injected the area today with 20 mg of Kenalog 5 mg Marcaine after sterile Betadine skin prep.  Tolerated procedure well without complications.  I sent her to [facility] to have orthotics made.

## 2018-02-17 ENCOUNTER — Ambulatory Visit: Payer: BLUE CROSS/BLUE SHIELD | Admitting: Orthotics

## 2018-02-17 DIAGNOSIS — M778 Other enthesopathies, not elsewhere classified: Secondary | ICD-10-CM

## 2018-02-17 DIAGNOSIS — M779 Enthesopathy, unspecified: Secondary | ICD-10-CM

## 2018-02-17 DIAGNOSIS — M7752 Other enthesopathy of left foot: Secondary | ICD-10-CM

## 2018-02-17 NOTE — Progress Notes (Signed)
Patient came in today to pick up custom made foot orthotics.  The goals were accomplished and the patient reported no dissatisfaction with said orthotics.  Patient was advised of breakin period and how to report any issues. 

## 2018-02-24 ENCOUNTER — Encounter: Payer: Self-pay | Admitting: Nurse Practitioner

## 2018-02-24 DIAGNOSIS — E559 Vitamin D deficiency, unspecified: Secondary | ICD-10-CM

## 2018-02-24 DIAGNOSIS — R7309 Other abnormal glucose: Secondary | ICD-10-CM

## 2018-02-24 DIAGNOSIS — M62838 Other muscle spasm: Secondary | ICD-10-CM

## 2018-02-24 DIAGNOSIS — R002 Palpitations: Secondary | ICD-10-CM

## 2018-02-24 DIAGNOSIS — R5383 Other fatigue: Secondary | ICD-10-CM | POA: Insufficient documentation

## 2018-03-03 ENCOUNTER — Ambulatory Visit: Payer: BLUE CROSS/BLUE SHIELD | Admitting: Nurse Practitioner

## 2018-06-08 ENCOUNTER — Encounter: Payer: Self-pay | Admitting: Nurse Practitioner

## 2018-06-08 ENCOUNTER — Ambulatory Visit: Payer: BLUE CROSS/BLUE SHIELD | Admitting: Nurse Practitioner

## 2018-06-08 VITALS — BP 132/80 | HR 92 | Temp 98.3°F | Ht 65.8 in | Wt 225.0 lb

## 2018-06-08 DIAGNOSIS — J209 Acute bronchitis, unspecified: Secondary | ICD-10-CM

## 2018-06-08 MED ORDER — PREDNISONE 10 MG (21) PO TBPK: ORAL_TABLET | ORAL | 0 refills | Status: DC

## 2018-06-08 MED ORDER — HYDROCODONE-HOMATROPINE 5-1.5 MG/5ML PO SYRP: 5.0000 mL | ORAL_SOLUTION | Freq: Four times a day (QID) | ORAL | 0 refills | Status: DC | PRN

## 2018-06-08 NOTE — Progress Notes (Signed)
Subjective:     Patient ID: Brenda Valencia , female    DOB: 06-19-1970 , 48 y.o.   MRN: [id number]   Chief Complaint  Patient presents with  . URI    Patient has had a cough for the past two weeks that wont go away runny nose and she has been sneezing. pt denies running a fever.    HPI  She works in a [employer] data [employer].  URI   This is a new problem. The current episode started 1 to 4 weeks ago. The problem has been gradually worsening. There has been no fever. Associated symptoms include coughing (thick and yellow secretions). Pertinent negatives include no chest pain, headaches or sore throat. Treatments tried: alka seltzer plus. The treatment provided no relief.     Past Medical History:  Diagnosis Date  . Abnormal glucose tolerance in mother complicating pregnancy 06/21/5359   Elevated early 1hr gtt; normal 3hr gtt x2  . Ectopic pregnancy    Treated with MTX  . Fibroids   . H/O varicella   . Headache(784.0)    otc med prn  . Rh negative status during pregnancy 10/19/2011  . Seasonal allergies      Family History  Problem Relation Age of Onset  . Hypertension Mother   . Arthritis Mother   . Cancer Mother   . Heart disease Father   . Hypertension Father   . Alcohol abuse Father   . Alcohol abuse Brother      Current Outpatient Medications:  Marland Kitchen  Multiple Vitamin (MULTIVITAMIN) tablet, Take 1 tablet by mouth daily., Disp: , Rfl:  .  VITAMIN D, CHOLECALCIFEROL, PO, Take 1 tablet by mouth daily., Disp: , Rfl:    Allergies  Allergen Reactions  . Penicillins Hives, Nausea And Vomiting and Nausea Only     Review of Systems  Constitutional: Negative for fatigue.  HENT: Negative for sore throat.   Eyes: Negative.   Respiratory: Positive for cough (thick and yellow secretions).   Cardiovascular: Negative.  Negative for chest pain, palpitations and leg swelling.  Neurological: Negative for dizziness and headaches.     Today's Vitals   06/08/18 1002  BP: 132/80   Pulse: 92  Temp: 98.3 F (36.8 C)  TempSrc: Oral  SpO2: 98%  Weight: 225 lb (102.1 kg)  Height: 5' 5.8" (1.671 m)  PainSc: 0-No pain   Body mass index is 36.54 kg/m.   Objective:  Physical Exam Vitals signs reviewed.  Constitutional:      Appearance: Normal appearance.  HENT:     Head: Normocephalic and atraumatic.     Right Ear: Ear canal and external ear normal. There is no impacted cerumen. Tympanic membrane is bulging.     Left Ear: Ear canal and external ear normal. There is no impacted cerumen. Tympanic membrane is bulging.     Nose: Nose normal. No congestion.     Mouth/Throat:     Mouth: Mucous membranes are moist.  Eyes:     Extraocular Movements: Extraocular movements intact.     Conjunctiva/sclera: Conjunctivae normal.     Pupils: Pupils are equal, round, and reactive to light.  Cardiovascular:     Rate and Rhythm: Normal rate and regular rhythm.     Pulses: Normal pulses.     Heart sounds: Normal heart sounds. No murmur.  Pulmonary:     Effort: Pulmonary effort is normal. No respiratory distress.     Breath sounds: Normal breath sounds.  Skin:    General:  Skin is warm and dry.  Neurological:     General: No focal deficit present.     Mental Status: She is alert.         Assessment And Plan:     1. Acute bronchitis, unspecified organism  Will treat with prednisone  Advised to take cough medication at bedtime.   - predniSONE (STERAPRED UNI-PAK 21 TAB) 10 MG (21) TBPK tablet; Take as directed  Dispense: 21 tablet; Refill: 0 - HYDROcodone-homatropine (HYDROMET) 5-1.5 MG/5ML syrup; Take 5 mLs by mouth every 6 (six) hours as needed for cough.  Dispense: 120 mL; Refill: 0   Minette Brine, FNP

## 2018-07-14 ENCOUNTER — Ambulatory Visit: Payer: BLUE CROSS/BLUE SHIELD | Admitting: Nurse Practitioner

## 2018-07-14 ENCOUNTER — Encounter: Payer: Self-pay | Admitting: Nurse Practitioner

## 2018-07-14 VITALS — BP 134/82 | HR 92 | Temp 98.0°F | Wt 223.2 lb

## 2018-07-14 DIAGNOSIS — R05 Cough: Secondary | ICD-10-CM

## 2018-07-14 DIAGNOSIS — R053 Chronic cough: Secondary | ICD-10-CM | POA: Insufficient documentation

## 2018-07-14 MED ORDER — AZITHROMYCIN 250 MG PO TABS: ORAL_TABLET | ORAL | 0 refills | Status: AC

## 2018-07-14 MED ORDER — ALBUTEROL SULFATE HFA 108 (90 BASE) MCG/ACT IN AERS: 2.0000 | INHALATION_SPRAY | Freq: Four times a day (QID) | RESPIRATORY_TRACT | 2 refills | Status: DC | PRN

## 2018-07-14 NOTE — Progress Notes (Signed)
Subjective:     Patient ID: Brenda Valencia , female    DOB: [birth year] , 48 y.o.   MRN: [id number]   Chief Complaint  Patient presents with  . other    cough two weeks since cough has returned,  thin white foamy mucus    HPI  Cough  This is a recurrent problem. The current episode started 1 to 4 weeks ago. The problem has been gradually worsening. The problem occurs constantly. The cough is productive of purulent sputum. Pertinent negatives include no chest pain, headaches, nasal congestion or wheezing. She has tried prescription cough suppressant (cough syrup made her nauseated) for the symptoms. The treatment provided no relief. There is no history of asthma.     Past Medical History:  Diagnosis Date  . Abnormal glucose tolerance in mother complicating pregnancy 05/23/9676   Elevated early 1hr gtt; normal 3hr gtt x2  . Ectopic pregnancy    Treated with MTX  . Fibroids   . H/O varicella   . Headache(784.0)    otc med prn  . Rh negative status during pregnancy 10/19/2011  . Seasonal allergies      Family History  Problem Relation Age of Onset  . Hypertension Mother   . Arthritis Mother   . Cancer Mother   . Heart disease Father   . Hypertension Father   . Alcohol abuse Father   . Alcohol abuse Brother      Current Outpatient Medications:  .  clindamycin (CLEOCIN T) 1 % lotion, Apply 1 application topically 2 (two) times daily., Disp: , Rfl:  .  diphenhydrAMINE (BENADRYL) 25 MG tablet, Take 25 mg by mouth every 6 (six) hours as needed., Disp: , Rfl:  .  Multiple Vitamin (MULTIVITAMIN) tablet, Take 1 tablet by mouth daily., Disp: , Rfl:  .  naproxen sodium (ALEVE) 220 MG tablet, Take 220 mg by mouth., Disp: , Rfl:  .  Pseudoephedrine-guaiFENesin (MUCINEX D MAX STRENGTH PO), Take 1 capsule by mouth 3 (three) times daily., Disp: , Rfl:  .  VITAMIN D, CHOLECALCIFEROL, PO, Take 1 tablet by mouth daily., Disp: , Rfl:  .  benzonatate (TESSALON) 100 MG capsule, Take 100 mg  by mouth 3 (three) times daily as needed for cough., Disp: , Rfl:  .  doxycycline (VIBRAMYCIN) 100 MG capsule, Take 100 mg by mouth 2 (two) times daily., Disp: , Rfl:  .  HYDROcodone-homatropine (HYDROMET) 5-1.5 MG/5ML syrup, Take 5 mLs by mouth every 6 (six) hours as needed for cough. (Patient not taking: Reported on 07/14/2018), Disp: 120 mL, Rfl: 0 .  MULTIPLE VITAMIN PO, Take 1 tablet by mouth once., Disp: , Rfl:  .  predniSONE (STERAPRED UNI-PAK 21 TAB) 10 MG (21) TBPK tablet, Take as directed (Patient not taking: Reported on 07/14/2018), Disp: 21 tablet, Rfl: 0   Allergies  Allergen Reactions  . Penicillins Hives, Nausea And Vomiting and Nausea Only  . Penicillins Hives     Review of Systems  Respiratory: Positive for cough. Negative for wheezing.   Cardiovascular: Negative for chest pain, palpitations and leg swelling.  Neurological: Negative for dizziness and headaches.     Today's Vitals   07/14/18 1031  BP: 134/82  Pulse: 92  Temp: 98 F (36.7 C)  SpO2: 98%  Weight: 223 lb 3.2 oz (101.2 kg)   Body mass index is 36.24 kg/m.   Objective:  Physical Exam Constitutional:      Appearance: Normal appearance.  Cardiovascular:     Rate and Rhythm:  Normal rate and regular rhythm.     Pulses: Normal pulses.     Heart sounds: Normal heart sounds. No murmur.  Pulmonary:     Effort: Pulmonary effort is normal. No respiratory distress.     Breath sounds: Examination of the right-upper field reveals decreased breath sounds. Decreased breath sounds present. No wheezing or rales.  Skin:    General: Skin is warm and dry.  Neurological:     Mental Status: She is alert.         Assessment And Plan:     1. Persistent cough  Decreased breath sound to right upper lobe, negative for wheezing  Given albuterol inhaler for coughing episodes and   Will treat with z-pack due to return in less than 3 weeks. - azithromycin (ZITHROMAX) 250 MG tablet; Take 2 tablets (500 mg) on  Day  1,  followed by 1 tablet (250 mg) once daily on Days 2 through 5.  Dispense: 6 each; Refill: 0       Minette Brine, FNP

## 2019-01-18 ENCOUNTER — Other Ambulatory Visit: Payer: Self-pay

## 2019-01-18 ENCOUNTER — Telehealth: Payer: Self-pay

## 2019-01-18 MED ORDER — ALBUTEROL SULFATE 108 (90 BASE) MCG/ACT IN AEPB: 2.0000 | INHALATION_SPRAY | Freq: Four times a day (QID) | RESPIRATORY_TRACT | 2 refills | Status: DC | PRN

## 2019-01-18 NOTE — Telephone Encounter (Signed)
I have sent albuterol sulfate to the pharmacy per request of Minette Brine FNP-BC due to the proair [facility] inhaler and the proair respiclick is not covered by patient's ins. YRL,RMA

## 2019-08-04 ENCOUNTER — Ambulatory Visit: Payer: BC Managed Care – PPO | Attending: Internal Medicine

## 2019-08-04 DIAGNOSIS — Z23 Encounter for immunization: Secondary | ICD-10-CM

## 2019-08-04 NOTE — Progress Notes (Signed)
   Covid-19 Vaccination Clinic  Name:  Brenda Valencia    MRN: [id number] DOB: 1970/09/13  08/04/2019  [name] was observed post Covid-19 immunization for 15 minutes without incident. She was provided with Vaccine Information Sheet and instruction to access the V-Safe system.   [name] was instructed to call 911 with any severe reactions post vaccine: Marland Kitchen Difficulty breathing  . Swelling of face and throat  . A fast heartbeat  . A bad rash all over body  . Dizziness and weakness   Immunizations Administered    Name Date Dose VIS Date Route   Pfizer COVID-19 Vaccine 08/04/2019 11:56 AM 0.3 mL 04/28/2019 Intramuscular   Manufacturer: [place]   Lot: [id number]   [place]: [id number]

## 2019-08-29 ENCOUNTER — Ambulatory Visit: Payer: BC Managed Care – PPO | Attending: Internal Medicine

## 2019-08-29 DIAGNOSIS — Z23 Encounter for immunization: Secondary | ICD-10-CM

## 2019-08-29 NOTE — Progress Notes (Signed)
   Covid-19 Vaccination Clinic  Name:  Brenda Valencia    MRN: [id number] DOB: 1970-10-16  08/29/2019  [name] was observed post Covid-19 immunization for 15 minutes without incident. She was provided with Vaccine Information Sheet and instruction to access the V-Safe system.   [name] was instructed to call 911 with any severe reactions post vaccine: Marland Kitchen Difficulty breathing  . Swelling of face and throat  . A fast heartbeat  . A bad rash all over body  . Dizziness and weakness   Immunizations Administered    Name Date Dose VIS Date Route   Pfizer COVID-19 Vaccine 08/29/2019  4:25 PM 0.3 mL 04/28/2019 Intramuscular   Manufacturer: [place]   Lot: [id number]   [place]: [id number]

## 2019-11-26 ENCOUNTER — Other Ambulatory Visit: Payer: Self-pay

## 2019-11-26 ENCOUNTER — Ambulatory Visit
Admission: EM | Admit: 2019-11-26 | Discharge: 2019-11-26 | Disposition: A | Discharge status: Home / Self Care | Payer: BC Managed Care – PPO

## 2019-11-26 DIAGNOSIS — M79644 Pain in right finger(s): Secondary | ICD-10-CM

## 2019-11-26 NOTE — ED Provider Notes (Signed)
[name]    CSN: [id number] Arrival date & time: 11/26/19  0827      History   Chief Complaint Chief Complaint  Patient presents with  . Hand Pain    right index finger up to wrist    HPI Brenda Valencia is a 49 y.o. female.   49 year old female comes in for 1 month history of right hand/index finger pain that has worsened the past 2 weeks.  Denies injury/trauma.  No pain at rest, pain with range of motion.  Decreased strength to the finger with locking at times.  Points to the PIP, MCP joint, MCP for pain.  States this can radiate to the wrist at times.  Occasional tingling without numbness.  Intermittent swelling overlying MCP joint.  Right-hand-dominant.     Past Medical History:  Diagnosis Date  . Abnormal glucose tolerance in mother complicating pregnancy 05/24/6158   Elevated early 1hr gtt; normal 3hr gtt x2  . Ectopic pregnancy    Treated with MTX  . Fibroids   . H/O varicella   . Headache(784.0)    otc med prn  . Rh negative status during pregnancy 10/19/2011  . Seasonal allergies     Patient Active Problem List   Diagnosis Date Noted  . Persistent cough 07/14/2018  . Vitamin D deficiency 02/24/2018  . Other muscle spasm 02/24/2018  . Awareness of heart beat 02/24/2018  . Fatigue 02/24/2018  . Abnormal glucose 02/24/2018  . Rh negative status during pregnancy 10/19/2011  . Obesity 09/29/2011  . Sterilization 09/29/2011  . Breech presentation 09/29/2011  . AMA (advanced maternal age) multigravida 35+ 03/19/2011  . Penicillin allergy 03/19/2011  . History of ectopic pregnancy 03/19/2011  . Fibroids 03/19/2011  . Spotting in early pregnancy 02/23/2011    Past Surgical History:  Procedure Laterality Date  . axillary nodes      2002  . CESAREAN SECTION  07/2006   NRFHR  . CYSTOSCOPY  10/19/2011   Procedure: CYSTOSCOPY;  Surgeon: Alwyn Pea, MD;  Location: [facility];  Service: Gynecology;  Laterality: N/A;  . TUBAL LIGATION    .  WISDOM TOOTH EXTRACTION     as teenager    OB History    Gravida  3   Para  2   Term  2   Preterm      AB  1   Living  2     SAB      TAB      Ectopic  1   Multiple      Live Births  2            Home Medications    Prior to Admission medications   Medication Sig Start Date End Date Taking? Authorizing Provider  cetirizine (ZYRTEC) 10 MG tablet Take 10 mg by mouth daily.   Yes [provider]  albuterol (PROVENTIL HFA;VENTOLIN HFA) 108 (90 Base) MCG/ACT inhaler Inhale 2 puffs into the lungs every 6 (six) hours as needed for wheezing or shortness of breath. 07/14/18   Minette Brine, FNP  Albuterol Sulfate 108 (90 Base) MCG/ACT AEPB Inhale 2 puffs into the lungs every 6 (six) hours as needed (for shortness of breath or wheezing). 01/18/19   Minette Brine, FNP  clindamycin (CLEOCIN T) 1 % lotion Apply 1 application topically 2 (two) times daily.    [provider]  HYDROcodone-homatropine (HYDROMET) 5-1.5 MG/5ML syrup Take 5 mLs by mouth every 6 (six) hours as needed for cough. Patient not  taking: Reported on 07/14/2018 06/08/18   Minette Brine, FNP  Multiple Vitamin (MULTIVITAMIN) tablet Take 1 tablet by mouth daily.    [provider]  MULTIPLE VITAMIN PO Take 1 tablet by mouth once.    [provider]  naproxen sodium (ALEVE) 220 MG tablet Take 220 mg by mouth.    [provider]  VITAMIN D, CHOLECALCIFEROL, PO Take 1 tablet by mouth daily.    [provider]  diphenhydrAMINE (BENADRYL) 25 MG tablet Take 25 mg by mouth every 6 (six) hours as needed.  11/26/19  [provider]    Family History Family History  Problem Relation Age of Onset  . Hypertension Mother   . Arthritis Mother   . Cancer Mother   . Heart disease Father   . Hypertension Father   . Alcohol abuse Father   . Alcohol abuse Brother     Social History Social History   Tobacco Use  . Smoking status: Never Smoker  . Smokeless  tobacco: Never Used  Substance Use Topics  . Alcohol use: No  . Drug use: No     Allergies   Penicillins and Penicillins   Review of Systems Review of Systems  Reason unable to perform ROS: See HPI as above.     Physical Exam Triage Vital Signs ED Triage Vitals  Enc Vitals Group     BP 11/26/19 0837 (!) 148/98     Pulse Rate 11/26/19 0837 90     Resp 11/26/19 0837 18     Temp 11/26/19 0837 97.9 F (36.6 C)     Temp Source 11/26/19 0837 Oral     SpO2 11/26/19 0837 97 %     Weight --      Height --      Head Circumference --      Peak Flow --      Pain Score 11/26/19 0851 8     Pain Loc --      Pain Edu? --      Excl. in [place]? --    No data found.  Updated Vital Signs BP (!) 148/98 (BP Location: Right Arm)   Pulse 90   Temp 97.9 F (36.6 C) (Oral)   Resp 18   LMP 11/12/2019 (Approximate)   SpO2 97%   Physical Exam Constitutional:      General: She is not in acute distress.    Appearance: Normal appearance. She is well-developed. She is not toxic-appearing or diaphoretic.  HENT:     Head: Normocephalic and atraumatic.  Eyes:     Conjunctiva/sclera: Conjunctivae normal.     Pupils: Pupils are equal, round, and reactive to light.  Pulmonary:     Effort: Pulmonary effort is normal. No respiratory distress.     Comments: Speaking in full sentences without difficulty Musculoskeletal:     Cervical back: Normal range of motion and neck supple.     Comments: No obvious swelling, erythema, warmth, contusion. Tenderness to palpation along distal 2nd MCP to PIP. No tenderness to palpation of wrist. Slightly decreased flexion of 2nd finger. Strength 5/5. Sensation intact. Radial pulse 2+, cap refill <2s  Skin:    General: Skin is warm and dry.  Neurological:     Mental Status: She is alert and oriented to person, place, and time.    UC Treatments / Results  Labs (all labs ordered are listed, but only abnormal results are displayed) Labs Reviewed - No data to  display  EKG  Radiology No results found.  Procedures Procedures (including critical care time)  Medications Ordered in UC Medications - No data to display  Initial Impression / Assessment and Plan / UC Course  I have reviewed the triage vital signs and the nursing notes.  Pertinent labs & imaging results that were available during my care of the patient were reviewed by me and considered in my medical decision making (see chart for details).    Discussed history and exam consistent with tendinitis. NSAIDs, ice compress, finger splint during activity. Return precautions given.  Final Clinical Impressions(s) / UC Diagnoses   Final diagnoses:  Pain of finger of right hand    ED Prescriptions    None     PDMP not reviewed this encounter.   Ok Edwards, PA-C 11/26/19 1000

## 2019-11-26 NOTE — ED Triage Notes (Signed)
Patient presents with right hand/index finger that has been ongoing x one month and worsening within the past 2 weeks.

## 2019-11-26 NOTE — Discharge Instructions (Addendum)
Likely inflammatory in nature such as tendinitis, trigger finger. Start naproxen 440mg  twice a day for 7-10 days. Can also add over the counter voltaren gel 2g 4 times a day as needed. Ice compress, finger splint during activity. Follow up with sports medicine if symptoms not improving.

## 2020-01-11 DIAGNOSIS — Z20822 Contact with and (suspected) exposure to covid-19: Secondary | ICD-10-CM | POA: Diagnosis not present

## 2020-02-13 DIAGNOSIS — Z20822 Contact with and (suspected) exposure to covid-19: Secondary | ICD-10-CM | POA: Diagnosis not present

## 2020-12-13 ENCOUNTER — Ambulatory Visit (HOSPITAL_COMMUNITY)
Admission: RE | Admit: 2020-12-13 | Discharge: 2020-12-13 | Disposition: A | Discharge status: Home / Self Care | Payer: BC Managed Care – PPO | Source: Ambulatory Visit | Attending: Physician Assistant | Admitting: Physician Assistant

## 2020-12-13 ENCOUNTER — Other Ambulatory Visit: Payer: Self-pay

## 2020-12-13 ENCOUNTER — Ambulatory Visit
Admission: EM | Admit: 2020-12-13 | Discharge: 2020-12-13 | Disposition: A | Discharge status: Home / Self Care | Payer: BC Managed Care – PPO | Attending: Physician Assistant | Admitting: Physician Assistant

## 2020-12-13 ENCOUNTER — Ambulatory Visit (INDEPENDENT_AMBULATORY_CARE_PROVIDER_SITE_OTHER): Payer: BC Managed Care – PPO

## 2020-12-13 DIAGNOSIS — M25572 Pain in left ankle and joints of left foot: Secondary | ICD-10-CM | POA: Diagnosis not present

## 2020-12-13 DIAGNOSIS — M79605 Pain in left leg: Secondary | ICD-10-CM

## 2020-12-13 DIAGNOSIS — I82812 Embolism and thrombosis of superficial veins of left lower extremities: Secondary | ICD-10-CM | POA: Insufficient documentation

## 2020-12-13 DIAGNOSIS — M79662 Pain in left lower leg: Secondary | ICD-10-CM | POA: Insufficient documentation

## 2020-12-13 IMAGING — DX DG ANKLE COMPLETE 3+V*L*
3 series · 3 of 3 positions shown · non-contrast
Comparison: None.

CLINICAL DATA: Pain, medial malleolus for

EXAM:
LEFT ANKLE COMPLETE - 3+ VIEW

[ankle ap]
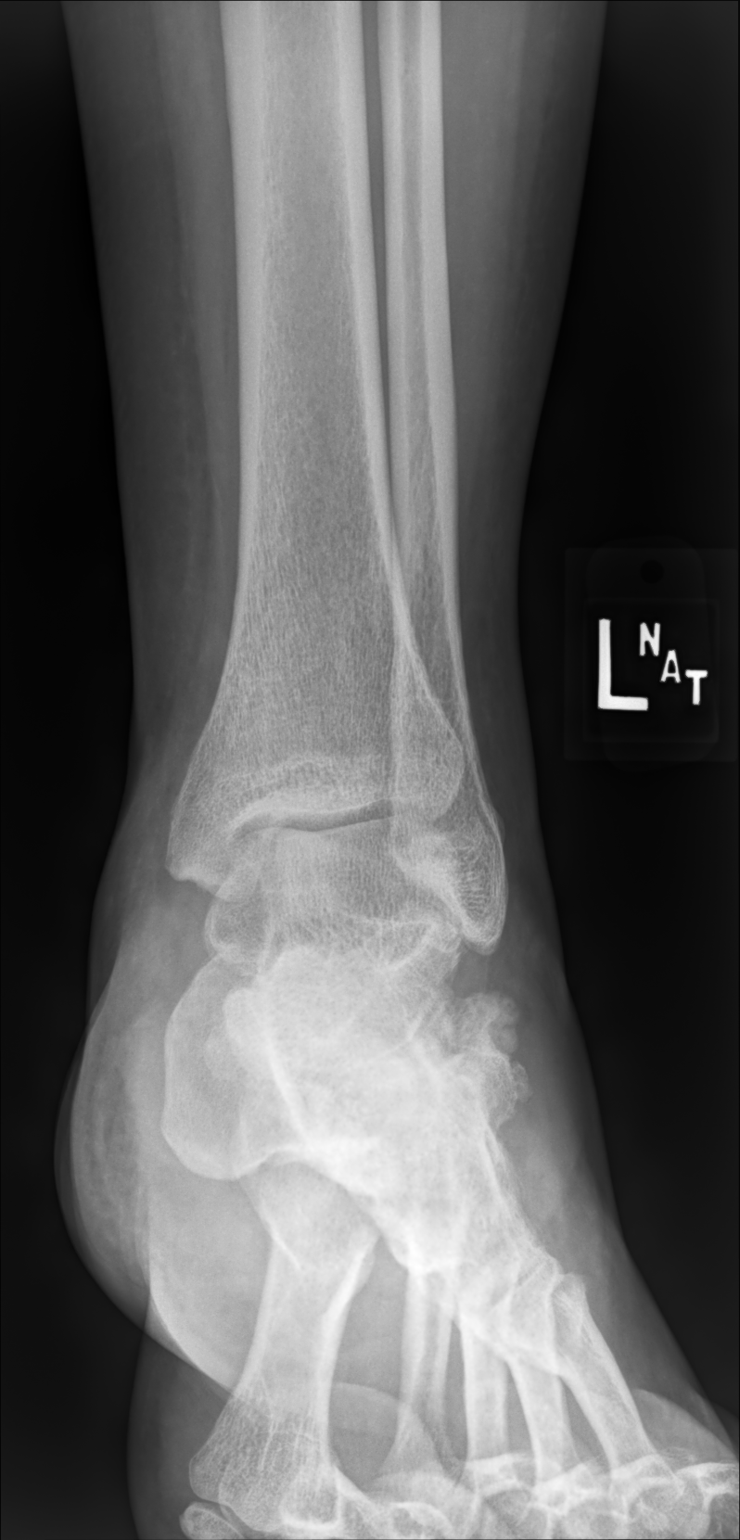

[ankle medial oblique]
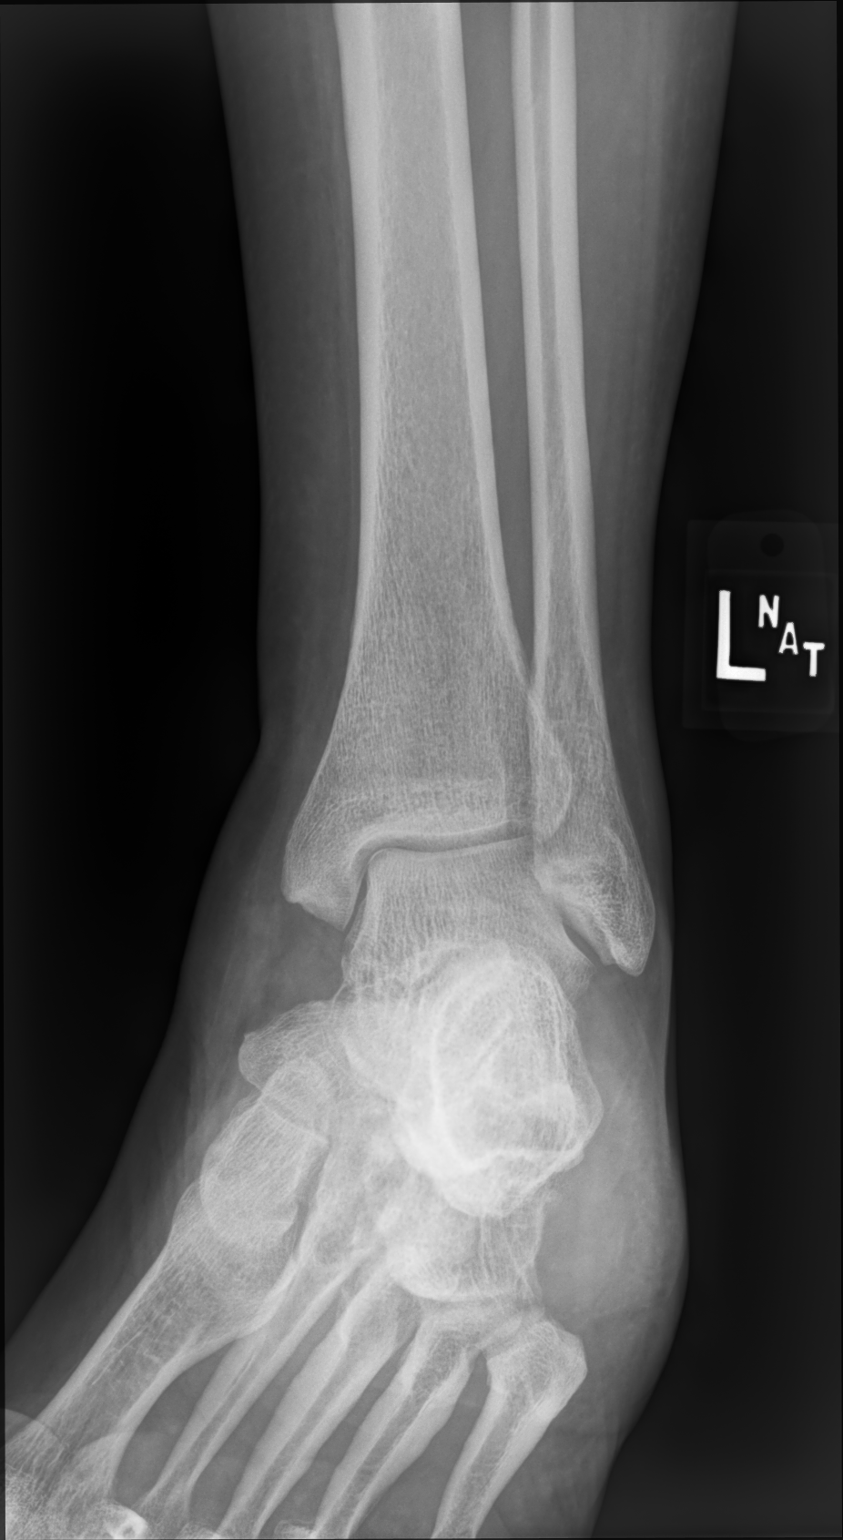

[ankle lat]
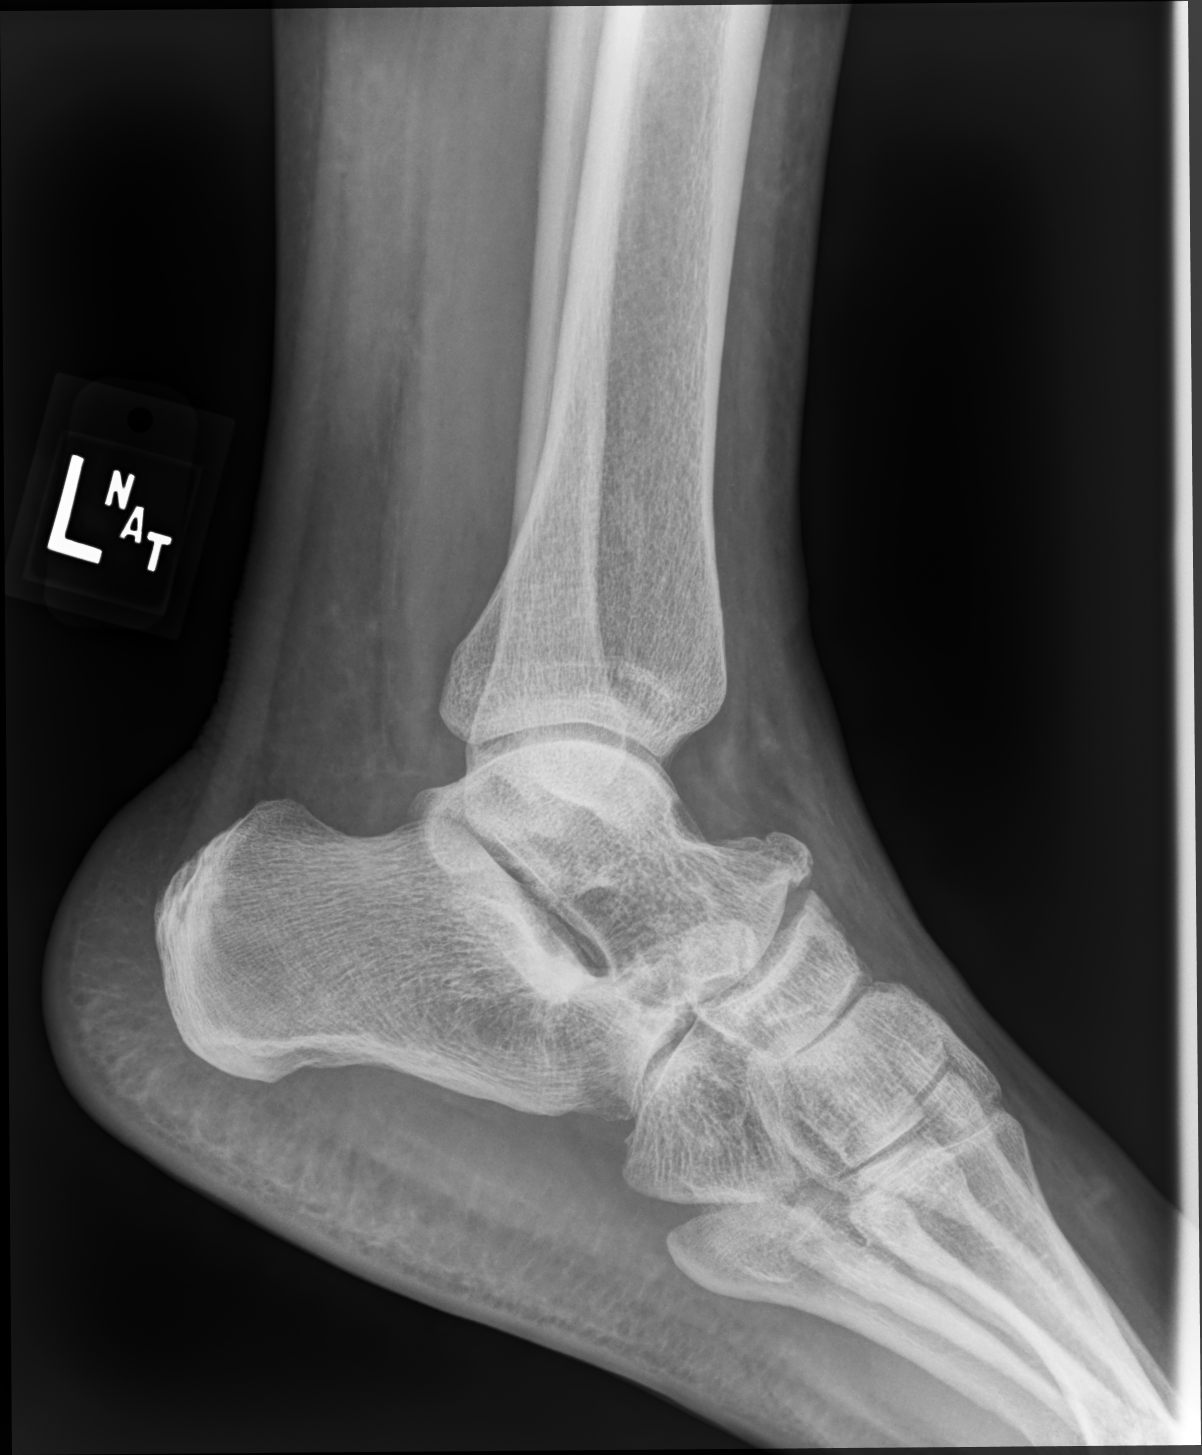

[3 of 3 positions shown; findings below may reference images not displayed]

FINDINGS: There is no evidence of fracture, dislocation, or joint effusion.
Talocalcaneal DJD. No other focal bone abnormality. Soft tissues are
unremarkable.
IMPRESSION: No acute findings.

## 2020-12-13 MED ORDER — NAPROXEN 500 MG PO TABS: 500.0000 mg | ORAL_TABLET | Freq: Two times a day (BID) | ORAL | 0 refills | Status: DC

## 2020-12-13 NOTE — Progress Notes (Signed)
Lower extremity venous has been completed.   Preliminary results in CV Proc.   Abram Sander 12/13/2020 1:08 PM

## 2020-12-13 NOTE — ED Triage Notes (Signed)
Pt c/o burning aching pain starting in left foot radiating through medial shin region. States sprained ankle years ago that has caused on and off problems requiring an ankle support device. Pain only happens when applying pressure onto foot. Pt is also part of foot and ankle care and has in the past received cortisone shots.

## 2020-12-13 NOTE — Discharge Instructions (Addendum)
Go get an ultrasound of your leg to rule out a blood clot.  If you have any chest pain or shortness of breath you need to go to the emergency room.  We are going to treat this as if it is a muscle strain with the RICE protocol.  Use rest, ice, compression, elevation for symptom management.  Use Naprosyn for pain relief.  You should not take NSAIDs with this medication due to risk of GI bleeding.  If you have any worsening symptoms please return for reevaluation.

## 2020-12-13 NOTE — ED Provider Notes (Signed)
[name]    CSN: [id number] Arrival date & time: 12/13/20  [id number]      History   Chief Complaint Chief Complaint  Patient presents with   Ankle Pain    HPI Brenda Valencia is a 50 y.o. female.   Patient presents today with a 5-day history of left ankle pain.  Patient denies any known injury or increase in activity prior to symptom onset.  Does report tripping on something on the stairs but denies any immediate pain following this.  She reports pain is gradually been spreading superiorly along left medial leg prompting evaluation today.  Pain is rated 5 on a 0-10 pain scale, localized to medial ankle with radiation superiorly, described as aching, worse with palpation, no alleviating factors identified.  She has not tried any over-the-counter medication for symptom management.  Denies any weakness, numbness, paresthesias.  She denies previous surgery but has previously injured this ankle.  She denies history of VTE event, recent immobilization, exogenous hormone use, malignancy.  She does report having COVID approximately 6 to 7 weeks ago.  Denies any chest pain, shortness of breath, significant leg swelling.   Past Medical History:  Diagnosis Date   Abnormal glucose tolerance in mother complicating pregnancy [id number]   Elevated early 1hr gtt; normal 3hr gtt x2   Ectopic pregnancy    Treated with MTX   Fibroids    H/O varicella    Headache(784.0)    otc med prn   Rh negative status during pregnancy 10/19/2011   Seasonal allergies     Patient Active Problem List   Diagnosis Date Noted   Persistent cough 07/14/2018   Vitamin D deficiency 02/24/2018   Other muscle spasm 02/24/2018   Awareness of heart beat 02/24/2018   Fatigue 02/24/2018   Abnormal glucose 02/24/2018   Rh negative status during pregnancy 10/19/2011   Obesity 09/29/2011   Sterilization 09/29/2011   Breech presentation 09/29/2011   AMA (advanced maternal age) multigravida 35+ 03/19/2011    Penicillin allergy 03/19/2011   History of ectopic pregnancy 03/19/2011   Fibroids 03/19/2011   Spotting in early pregnancy 02/23/2011    Past Surgical History:  Procedure Laterality Date   axillary nodes      2002   CESAREAN SECTION  07/2006   NRFHR   CYSTOSCOPY  10/19/2011   Procedure: CYSTOSCOPY;  Surgeon: Alwyn Pea, MD;  Location: [facility];  Service: Gynecology;  Laterality: N/A;   TUBAL LIGATION     WISDOM TOOTH EXTRACTION     as teenager    OB History     Gravida  3   Para  2   Term  2   Preterm      AB  1   Living  2      SAB      IAB      Ectopic  1   Multiple      Live Births  2            Home Medications    Prior to Admission medications   Medication Sig Start Date End Date Taking? Authorizing Provider  naproxen (NAPROSYN) 500 MG tablet Take 1 tablet (500 mg total) by mouth 2 (two) times daily. 12/13/20  Yes Thiago Ragsdale K, PA-C  cetirizine (ZYRTEC) 10 MG tablet Take 10 mg by mouth daily.    [provider]  clindamycin (CLEOCIN T) 1 % lotion Apply 1 application topically 2 (two) times daily.    [provider]  HYDROcodone-homatropine (HYDROMET) 5-1.5 MG/5ML syrup Take 5 mLs by mouth every 6 (six) hours as needed for cough. Patient not taking: Reported on 07/14/2018 06/08/18   Minette Brine, FNP  Multiple Vitamin (MULTIVITAMIN) tablet Take 1 tablet by mouth daily.    [provider]  MULTIPLE VITAMIN PO Take 1 tablet by mouth once.    [provider]  VITAMIN D, CHOLECALCIFEROL, PO Take 1 tablet by mouth daily.    [provider]  diphenhydrAMINE (BENADRYL) 25 MG tablet Take 25 mg by mouth every 6 (six) hours as needed.  11/26/19  [provider]    Family History Family History  Problem Relation Age of Onset   Hypertension Mother    Arthritis Mother    Cancer Mother    Heart disease Father    Hypertension Father    Alcohol abuse Father    Alcohol abuse Brother     Social  History Social History   Tobacco Use   Smoking status: Never   Smokeless tobacco: Never  Substance Use Topics   Alcohol use: No   Drug use: No     Allergies   Penicillins and Penicillins   Review of Systems Review of Systems  Constitutional:  Positive for activity change. Negative for appetite change, fatigue and fever.  Respiratory:  Negative for cough and shortness of breath.   Cardiovascular:  Negative for chest pain.  Gastrointestinal:  Negative for abdominal pain, diarrhea and vomiting.  Musculoskeletal:  Positive for arthralgias, gait problem and joint swelling. Negative for myalgias.  Neurological:  Negative for dizziness, weakness, light-headedness, numbness and headaches.    Physical Exam Triage Vital Signs ED Triage Vitals [12/13/20 0829]  Enc Vitals Group     BP (!) 147/88     Pulse Rate 100     Resp 18     Temp 98.3 F (36.8 C)     Temp Source Oral     SpO2 96 %     Weight      Height      Head Circumference      Peak Flow      Pain Score 5     Pain Loc      Pain Edu?      Excl. in [place]?    No data found.  Updated Vital Signs BP (!) 147/88 (BP Location: Left Arm)   Pulse 100   Temp 98.3 F (36.8 C) (Oral)   Resp 18   LMP 11/18/2020 (Approximate)   SpO2 96%   Visual Acuity Right Eye Distance:   Left Eye Distance:   Bilateral Distance:    Right Eye Near:   Left Eye Near:    Bilateral Near:     Physical Exam Vitals reviewed.  Constitutional:      General: She is awake. She is not in acute distress.    Appearance: Normal appearance. She is normal weight. She is not ill-appearing.     Comments: Very pleasant female appears stated age no acute distress  HENT:     Head: Normocephalic and atraumatic.  Cardiovascular:     Rate and Rhythm: Normal rate and regular rhythm.     Pulses:          Posterior tibial pulses are 2+ on the right side and 2+ on the left side.     Heart sounds: Normal heart sounds, S1 normal and S2 normal. No murmur  heard.    Comments: Capillary refill within 2 seconds left toes  Pulmonary:     Effort: Pulmonary effort is normal.     Breath sounds: Normal breath sounds. No wheezing, rhonchi or rales.     Comments: Clear to auscultation bilaterally Abdominal:     Palpations: Abdomen is soft.     Tenderness: There is no abdominal tenderness.  Musculoskeletal:     Left ankle: No swelling. Tenderness present over the medial malleolus. Decreased range of motion. Anterior drawer test negative.     Left Achilles Tendon: No tenderness.     Left foot: Normal range of motion.     Comments: Left ankle/leg: Tender palpation over medial malleolus with radiation superiorly.  No deformity noted.  Decreased range of motion in ankle with inversion and eversion secondary to pain.  Foot neurovascularly intact.  Feet:     Left foot:     Protective Sensation: 10 sites tested.  10 sites sensed.     Skin integrity: No ulcer, skin breakdown, erythema or warmth.     Toenail Condition: Left toenails are normal.  Psychiatric:        Behavior: Behavior is cooperative.     UC Treatments / Results  Labs (all labs ordered are listed, but only abnormal results are displayed) Labs Reviewed - No data to display  EKG   Radiology DG Ankle Complete Left  Result Date: 12/13/2020 CLINICAL DATA:  Pain, medial malleolus for EXAM: LEFT ANKLE COMPLETE - 3+ VIEW COMPARISON:  None. FINDINGS: There is no evidence of fracture, dislocation, or joint effusion. Talocalcaneal DJD. No other focal bone abnormality. Soft tissues are unremarkable. IMPRESSION: No acute findings. Electronically Signed   By: Lucrezia Europe M.D.   On: 12/13/2020 09:44    Procedures Procedures (including critical care time)  Medications Ordered in UC Medications - No data to display  Initial Impression / Assessment and Plan / UC Course  I have reviewed the triage vital signs and the nursing notes.  Pertinent labs & imaging results that were available during my  care of the patient were reviewed by me and considered in my medical decision making (see chart for details).      X-ray obtained given pain over medial malleolus showed no acute findings.  Ultrasound obtained given spread of pain and to left lower leg after patient having had COVID within the past few months.  She was encouraged to use RICE protocol for symptom relief and Ace bandage was applied for comfort during visit today.  She was prescribed Naprosyn with instruction not to take additional NSAIDs with this medication due to risk of GI bleeding.  Discussed alarm symptoms that warrant emergent evaluation.  Strict return precautions given to which patient expressed understanding  Final Clinical Impressions(s) / UC Diagnoses   Final diagnoses:  Acute left ankle pain  Left leg pain     Discharge Instructions      Go get an ultrasound of your leg to rule out a blood clot.  If you have any chest pain or shortness of breath you need to go to the emergency room.  We are going to treat this as if it is a muscle strain with the RICE protocol.  Use rest, ice, compression, elevation for symptom management.  Use Naprosyn for pain relief.  You should not take NSAIDs with this medication due to risk of GI bleeding.  If you have any worsening symptoms please return for reevaluation.     ED Prescriptions     Medication Sig Dispense Auth. Provider   naproxen (NAPROSYN) 500  MG tablet Take 1 tablet (500 mg total) by mouth 2 (two) times daily. 14 tablet Lopez Dentinger, Derry Skill, PA-C      PDMP not reviewed this encounter.   Terrilee Croak, PA-C 12/13/20 [id number]

## 2021-02-03 ENCOUNTER — Ambulatory Visit
Admission: EM | Admit: 2021-02-03 | Discharge: 2021-02-03 | Disposition: A | Discharge status: Home / Self Care | Payer: BC Managed Care – PPO | Attending: Internal Medicine | Admitting: Internal Medicine

## 2021-02-03 ENCOUNTER — Other Ambulatory Visit: Payer: Self-pay

## 2021-02-03 DIAGNOSIS — J069 Acute upper respiratory infection, unspecified: Secondary | ICD-10-CM | POA: Insufficient documentation

## 2021-02-03 DIAGNOSIS — Z20822 Contact with and (suspected) exposure to covid-19: Secondary | ICD-10-CM | POA: Diagnosis not present

## 2021-02-03 DIAGNOSIS — H65193 Other acute nonsuppurative otitis media, bilateral: Secondary | ICD-10-CM | POA: Insufficient documentation

## 2021-02-03 DIAGNOSIS — J029 Acute pharyngitis, unspecified: Secondary | ICD-10-CM | POA: Insufficient documentation

## 2021-02-03 LAB — POCT RAPID STREP A (OFFICE): Rapid Strep A Screen: NEGATIVE

## 2021-02-03 MED ORDER — CETIRIZINE HCL 10 MG PO TABS: 10.0000 mg | ORAL_TABLET | Freq: Every day | ORAL | 0 refills | Status: AC

## 2021-02-03 MED ORDER — FLUTICASONE PROPIONATE 50 MCG/ACT NA SUSP: 1.0000 | Freq: Every day | NASAL | 0 refills | Status: DC

## 2021-02-03 MED ORDER — DM-GUAIFENESIN ER 30-600 MG PO TB12: 1.0000 | ORAL_TABLET | Freq: Two times a day (BID) | ORAL | 0 refills | Status: DC

## 2021-02-03 MED ORDER — PREDNISONE 20 MG PO TABS: 40.0000 mg | ORAL_TABLET | Freq: Every day | ORAL | 0 refills | Status: AC

## 2021-02-03 NOTE — ED Provider Notes (Signed)
[name]    CSN: [id number] Arrival date & time: 02/03/21  0808      History   Chief Complaint Chief Complaint  Patient presents with   Cough    HPI Brenda Valencia is a 50 y.o. female.   Patient presenting with cough, sore throat, nasal congestion that has been present for approximately 6 days.  Patient states that cough is productive with clear sputum.  Patient also complaining of "chest tightness".  Having intermittent shortness of breath with exertion.  Denies current shortness of breath or any chest pain.  Has taken Alka-Seltzer cold and flu over-the-counter with minimal relief of symptoms.  Has also taken 2 at home COVID-19 test that were both negative.  Denies any known sick contacts or fevers.   Cough  Past Medical History:  Diagnosis Date   Abnormal glucose tolerance in mother complicating pregnancy 06/25/7865   Elevated early 1hr gtt; normal 3hr gtt x2   Ectopic pregnancy    Treated with MTX   Fibroids    H/O varicella    Headache(784.0)    otc med prn   Rh negative status during pregnancy 10/19/2011   Seasonal allergies     Patient Active Problem List   Diagnosis Date Noted   Persistent cough 07/14/2018   Vitamin D deficiency 02/24/2018   Other muscle spasm 02/24/2018   Awareness of heart beat 02/24/2018   Fatigue 02/24/2018   Abnormal glucose 02/24/2018   Rh negative status during pregnancy 10/19/2011   Obesity 09/29/2011   Sterilization 09/29/2011   Breech presentation 09/29/2011   AMA (advanced maternal age) multigravida 35+ 03/19/2011   Penicillin allergy 03/19/2011   History of ectopic pregnancy 03/19/2011   Fibroids 03/19/2011   Spotting in early pregnancy 02/23/2011    Past Surgical History:  Procedure Laterality Date   axillary nodes      2002   CESAREAN SECTION  07/2006   NRFHR   CYSTOSCOPY  10/19/2011   Procedure: CYSTOSCOPY;  Surgeon: Alwyn Pea, MD;  Location: [facility];  Service: Gynecology;  Laterality: N/A;    TUBAL LIGATION     WISDOM TOOTH EXTRACTION     as teenager    OB History     Gravida  3   Para  2   Term  2   Preterm      AB  1   Living  2      SAB      IAB      Ectopic  1   Multiple      Live Births  2            Home Medications    Prior to Admission medications   Medication Sig Start Date End Date Taking? Authorizing Provider  cetirizine (ZYRTEC) 10 MG tablet Take 1 tablet (10 mg total) by mouth daily. 02/03/21  Yes Odis Luster, FNP  dextromethorphan-guaiFENesin (MUCINEX DM) 30-600 MG 12hr tablet Take 1 tablet by mouth 2 (two) times daily. 02/03/21  Yes Odis Luster, FNP  fluticasone (FLONASE) 50 MCG/ACT nasal spray Place 1 spray into both nostrils daily. 02/03/21  Yes Odis Luster, FNP  predniSONE (DELTASONE) 20 MG tablet Take 2 tablets (40 mg total) by mouth daily for 5 days. 02/03/21 02/08/21 Yes Odis Luster, FNP  Multiple Vitamin (MULTIVITAMIN) tablet Take 1 tablet by mouth daily.    [provider]  MULTIPLE VITAMIN PO Take 1 tablet by mouth once.    [provider]  VITAMIN D, CHOLECALCIFEROL, PO Take 1 tablet by mouth daily.    [provider]  diphenhydrAMINE (BENADRYL) 25 MG tablet Take 25 mg by mouth every 6 (six) hours as needed.  11/26/19  [provider]    Family History Family History  Problem Relation Age of Onset   Hypertension Mother    Arthritis Mother    Cancer Mother    Heart disease Father    Hypertension Father    Alcohol abuse Father    Alcohol abuse Brother     Social History Social History   Tobacco Use   Smoking status: Never   Smokeless tobacco: Never  Substance Use Topics   Alcohol use: No   Drug use: No     Allergies   Penicillins and Penicillins   Review of Systems Review of Systems Per HPI  Physical Exam Triage Vital Signs ED Triage Vitals  Enc Vitals Group     BP 02/03/21 0822 (!) 151/106     Pulse Rate 02/03/21 0822 (!) 103     Resp 02/03/21 0822  18     Temp 02/03/21 0822 98.7 F (37.1 C)     Temp Source 02/03/21 0822 Oral     SpO2 02/03/21 0822 99 %     Weight --      Height --      Head Circumference --      Peak Flow --      Pain Score 02/03/21 0824 0     Pain Loc --      Pain Edu? --      Excl. in [place]? --    No data found.  Updated Vital Signs BP (!) 151/106 (BP Location: Right Arm)   Pulse (!) 103   Temp 98.7 F (37.1 C) (Oral)   Resp 18   LMP 01/14/2021   SpO2 99%   Visual Acuity Right Eye Distance:   Left Eye Distance:   Bilateral Distance:    Right Eye Near:   Left Eye Near:    Bilateral Near:     Physical Exam Constitutional:      General: She is not in acute distress.    Appearance: Normal appearance.  HENT:     Head: Normocephalic and atraumatic.     Right Ear: Ear canal normal. A middle ear effusion is present. Tympanic membrane is not erythematous or bulging.     Left Ear: Ear canal normal. A middle ear effusion is present. Tympanic membrane is not erythematous or bulging.     Nose: Congestion present.     Mouth/Throat:     Mouth: Mucous membranes are moist.     Pharynx: Posterior oropharyngeal erythema present.  Eyes:     Extraocular Movements: Extraocular movements intact.     Conjunctiva/sclera: Conjunctivae normal.     Pupils: Pupils are equal, round, and reactive to light.  Cardiovascular:     Rate and Rhythm: Normal rate and regular rhythm.     Pulses: Normal pulses.     Heart sounds: Normal heart sounds.  Pulmonary:     Effort: Pulmonary effort is normal. No respiratory distress.     Breath sounds: Normal breath sounds. No stridor. No wheezing or rhonchi.     Comments: Harsh cough on exam. Abdominal:     General: Abdomen is flat. Bowel sounds are normal.     Palpations: Abdomen is soft.  Musculoskeletal:        General: Normal range of motion.     Cervical  back: Normal range of motion.  Skin:    General: Skin is warm and dry.  Neurological:     General: No focal deficit  present.     Mental Status: She is alert and oriented to person, place, and time. Mental status is at baseline.  Psychiatric:        Mood and Affect: Mood normal.        Behavior: Behavior normal.     UC Treatments / Results  Labs (all labs ordered are listed, but only abnormal results are displayed) Labs Reviewed  CULTURE, GROUP A STREP ([place])  NOVEL CORONAVIRUS, NAA  POCT RAPID STREP A (OFFICE)    EKG   Radiology No results found.  Procedures Procedures (including critical care time)  Medications Ordered in UC Medications - No data to display  Initial Impression / Assessment and Plan / UC Course  I have reviewed the triage vital signs and the nursing notes.  Pertinent labs & imaging results that were available during my care of the patient were reviewed by me and considered in my medical decision making (see chart for details).     Patient presents with symptoms likely from a viral upper respiratory infection. Differential includes bacterial pneumonia, sinusitis, allergic rhinitis, Covid 19. Do not suspect underlying cardiopulmonary process. Symptoms seem unlikely related to ACS, CHF or COPD exacerbations, pneumonia, pneumothorax. Patient is nontoxic appearing and not in need of emergent medical intervention.  Prescribed cetirizine and Flonase to help with bilateral middle ear effusion.  Mucinex DM for cough.  Prednisone x5 days due to harsh cough on exam and to decrease inflammation in chest.  Rapid strep was negative today.  Throat culture and COVID-19 viral swab pending.  Return if symptoms fail to improve in 1-2 weeks or you develop shortness of breath, chest pain, severe headache. Patient states understanding and is agreeable.  Discharged with PCP followup.  Final Clinical Impressions(s) / UC Diagnoses   Final diagnoses:  Viral upper respiratory tract infection with cough  Acute MEE (middle ear effusion), bilateral  Sore throat  Encounter for laboratory testing  for COVID-19 virus     Discharge Instructions      You have been prescribed cetirizine, cough medication, Flonase, prednisone to help with symptoms.  Please follow-up with primary care or urgent care if symptoms persist.  Rapid strep test was negative.  Throat culture and COVID-19 viral swab are pending.  We will call if they are positive.     ED Prescriptions     Medication Sig Dispense Auth. Provider   predniSONE (DELTASONE) 20 MG tablet Take 2 tablets (40 mg total) by mouth daily for 5 days. 10 tablet Odis Luster, FNP   dextromethorphan-guaiFENesin [facility] DM) 30-600 MG 12hr tablet Take 1 tablet by mouth 2 (two) times daily. 30 tablet Odis Luster, FNP   cetirizine (ZYRTEC) 10 MG tablet Take 1 tablet (10 mg total) by mouth daily. 30 tablet Odis Luster, FNP   fluticasone (FLONASE) 50 MCG/ACT nasal spray Place 1 spray into both nostrils daily. 16 g Odis Luster, FNP      PDMP not reviewed this encounter.   Odis Luster, FNP 02/03/21 [phone]

## 2021-02-03 NOTE — ED Triage Notes (Signed)
Pt c/o cough, sore throat, and nasal congestion since last Tuesday. Pt c/o cough has worsen and now having chest congestion. States had 2 neg home covid test. Taking OTC with no relief.

## 2021-02-03 NOTE — Discharge Instructions (Addendum)
You have been prescribed cetirizine, cough medication, Flonase, prednisone to help with symptoms.  Please follow-up with primary care or urgent care if symptoms persist.  Rapid strep test was negative.  Throat culture and COVID-19 viral swab are pending.  We will call if they are positive.

## 2021-02-04 LAB — NOVEL CORONAVIRUS, NAA: SARS-CoV-2, NAA: NOT DETECTED

## 2021-02-04 LAB — SARS-COV-2, NAA 2 DAY TAT

## 2021-02-06 LAB — CULTURE, GROUP A STREP (THRC)

## 2022-06-12 ENCOUNTER — Ambulatory Visit
Admission: EM | Admit: 2022-06-12 | Discharge: 2022-06-12 | Disposition: A | Discharge status: Home / Self Care | Payer: BC Managed Care – PPO | Attending: Physician Assistant | Admitting: Physician Assistant

## 2022-06-12 DIAGNOSIS — J0100 Acute maxillary sinusitis, unspecified: Secondary | ICD-10-CM | POA: Diagnosis not present

## 2022-06-12 DIAGNOSIS — J069 Acute upper respiratory infection, unspecified: Secondary | ICD-10-CM

## 2022-06-12 MED ORDER — DM-GUAIFENESIN ER 30-600 MG PO TB12: 1.0000 | ORAL_TABLET | Freq: Two times a day (BID) | ORAL | 0 refills | Status: DC

## 2022-06-12 MED ORDER — AZITHROMYCIN 250 MG PO TABS: ORAL_TABLET | ORAL | 0 refills | Status: DC

## 2022-06-12 MED ORDER — FLUTICASONE PROPIONATE 50 MCG/ACT NA SUSP: 1.0000 | Freq: Every day | NASAL | 0 refills | Status: AC

## 2022-06-12 NOTE — ED Provider Notes (Signed)
[name]    CSN: [id number] Arrival date & time: 06/12/22  2119      History   Chief Complaint Chief Complaint  Patient presents with   Cough   Otalgia    HPI Brenda Valencia is a 52 y.o. female.   52 year old female presents with cough, sinus congestion and headache.  Patient indicates for the past 3 days she has been having increasing upper respiratory congestion, head congestion, sinus pressure and pain mainly maxillary areas, intermittent frontal headaches associated.  She indicates she has also had bilateral ear pressure and pain with congestion.  Production has been yellow to purulent.  She also indicates she has been having cough with chest congestion.  She indicates she has been having some coughing exacerbations and spasms that has caused her to have some chest wall discomfort when she coughs and breathes deep.  She indicates production has been clear to yellow.  She indicates she has not had fever, chills, body aches or pain.  She indicates she has been taking OTC medications without relief.  She is tolerating fluids well.  She is without wheezing or shortness of breath   Cough Associated symptoms: ear pain   Otalgia Associated symptoms: cough     Past Medical History:  Diagnosis Date   Abnormal glucose tolerance in mother complicating pregnancy 08/17/7406   Elevated early 1hr gtt; normal 3hr gtt x2   Ectopic pregnancy    Treated with MTX   Fibroids    H/O varicella    Headache(784.0)    otc med prn   Rh negative status during pregnancy 10/19/2011   Seasonal allergies     Patient Active Problem List   Diagnosis Date Noted   Persistent cough 07/14/2018   Vitamin D deficiency 02/24/2018   Other muscle spasm 02/24/2018   Awareness of heart beat 02/24/2018   Fatigue 02/24/2018   Abnormal glucose 02/24/2018   Menorrhagia 05/18/2014   Rh negative status during pregnancy 10/19/2011   Obesity 09/29/2011   Sterilization 09/29/2011   Breech  presentation 09/29/2011   AMA (advanced maternal age) multigravida 35+ 03/19/2011   Penicillin allergy 03/19/2011   History of ectopic pregnancy 03/19/2011   Fibroids 03/19/2011   Spotting in early pregnancy 02/23/2011    Past Surgical History:  Procedure Laterality Date   axillary nodes      2002   CESAREAN SECTION  07/2006   NRFHR   CYSTOSCOPY  10/19/2011   Procedure: CYSTOSCOPY;  Surgeon: Alwyn Pea, MD;  Location: [facility];  Service: Gynecology;  Laterality: N/A;   TUBAL LIGATION     WISDOM TOOTH EXTRACTION     as teenager    OB History     Gravida  3   Para  2   Term  2   Preterm      AB  1   Living  2      SAB      IAB      Ectopic  1   Multiple      Live Births  2            Home Medications    Prior to Admission medications   Medication Sig Start Date End Date Taking? Authorizing Provider  azithromycin (ZITHROMAX Z-PAK) 250 MG tablet 2 tablets initially and then 1 tablet daily until completed. 06/12/22  Yes Nyoka Lint, PA-C  dextromethorphan-guaiFENesin [facility] DM) 30-600 MG 12hr tablet Take 1 tablet by mouth 2 (two) times daily. 06/12/22  Yes  Nyoka Lint, PA-C  cetirizine (ZYRTEC) 10 MG tablet Take 1 tablet (10 mg total) by mouth daily. 02/03/21   Teodora Medici, FNP  dextromethorphan-guaiFENesin (MUCINEX DM) 30-600 MG 12hr tablet Take 1 tablet by mouth 2 (two) times daily. 02/03/21   Teodora Medici, FNP  fluticasone (FLONASE) 50 MCG/ACT nasal spray Place 1 spray into both nostrils daily. 06/12/22   Nyoka Lint, PA-C  Multiple Vitamin (MULTIVITAMIN) tablet Take 1 tablet by mouth daily.    [provider]  MULTIPLE VITAMIN PO Take 1 tablet by mouth once.    [provider]  VITAMIN D, CHOLECALCIFEROL, PO Take 1 tablet by mouth daily.    [provider]  diphenhydrAMINE (BENADRYL) 25 MG tablet Take 25 mg by mouth every 6 (six) hours as needed.  11/26/19  [provider]    Family History Family History   Problem Relation Age of Onset   Hypertension Mother    Arthritis Mother    Cancer Mother    Heart disease Father    Hypertension Father    Alcohol abuse Father    Alcohol abuse Brother     Social History Social History   Tobacco Use   Smoking status: Never   Smokeless tobacco: Never  Substance Use Topics   Alcohol use: No   Drug use: No     Allergies   Penicillins and Penicillins   Review of Systems Review of Systems  HENT:  Positive for ear pain, sinus pressure and sinus pain (maxillary).   Respiratory:  Positive for cough.      Physical Exam Triage Vital Signs ED Triage Vitals  Enc Vitals Group     BP 06/12/22 0952 133/85     Pulse Rate 06/12/22 0952 99     Resp 06/12/22 0952 18     Temp 06/12/22 0952 98.9 F (37.2 C)     Temp Source 06/12/22 0952 Oral     SpO2 06/12/22 0952 98 %     Weight --      Height --      Head Circumference --      Peak Flow --      Pain Score 06/12/22 0951 4     Pain Loc --      Pain Edu? --      Excl. in [place]? --    No data found.  Updated Vital Signs BP 133/85 (BP Location: Left Arm)   Pulse 99   Temp 98.9 F (37.2 C) (Oral)   Resp 18   LMP 06/07/2022   SpO2 98%   Visual Acuity Right Eye Distance:   Left Eye Distance:   Bilateral Distance:    Right Eye Near:   Left Eye Near:    Bilateral Near:     Physical Exam Constitutional:      Appearance: Normal appearance.  HENT:     Right Ear: Ear canal normal. Tympanic membrane is injected.     Left Ear: Ear canal normal. Tympanic membrane is injected.     Mouth/Throat:     Mouth: Mucous membranes are moist.     Pharynx: Oropharynx is clear.     Comments: Face: Bilateral maxillary tenderness is present on palpation. Cardiovascular:     Rate and Rhythm: Normal rate and regular rhythm.     Heart sounds: Normal heart sounds.  Pulmonary:     Effort: Pulmonary effort is normal.     Breath sounds: Normal breath sounds and air entry. No wheezing, rhonchi or  rales.   Lymphadenopathy:     Cervical: No cervical adenopathy.  Neurological:     Mental Status: She is alert.      UC Treatments / Results  Labs (all labs ordered are listed, but only abnormal results are displayed) Labs Reviewed - No data to display  EKG   Radiology No results found.  Procedures Procedures (including critical care time)  Medications Ordered in UC Medications - No data to display  Initial Impression / Assessment and Plan / UC Course  I have reviewed the triage vital signs and the nursing notes.  Pertinent labs & imaging results that were available during my care of the patient were reviewed by me and considered in my medical decision making (see chart for details).    Plan: The diagnosis will be treated with the following: 1.  Upper respiratory tract infection: A.  Mucinex DM every 12 hours to control congestion and cough. 2.  Maxillary sinusitis: A.  Flonase nasal spray, 2 sprays each nostril once daily to help decrease sinus congestion. B.  Zithromax 250 mg, 2 tablets initially and then 1 daily until completed to treat infection. C.  Patient advised take ibuprofen or Tylenol to help relieve discomfort and pain. 3.  Patient advised follow-up PCP return to urgent care as needed. Final Clinical Impressions(s) / UC Diagnoses   Final diagnoses:  Acute upper respiratory infection  Acute non-recurrent maxillary sinusitis     Discharge Instructions      Advised use of Flonase nasal spray, 2 sprays each nostril once daily to help decrease upper respiratory and sinus congestion. Advise use of Mucinex DM every 12 hours to help control cough and chest congestion. Advised take the Zithromax, 2 tablets initially today and then 1 tablet daily until completed to treat sinus infection. Advise use ibuprofen or Tylenol to help reduce pain and discomfort.  Advised follow-up PCP or return to urgent care if symptoms fail to improve.    ED Prescriptions      Medication Sig Dispense Auth. Provider   dextromethorphan-guaiFENesin (MUCINEX DM) 30-600 MG 12hr tablet Take 1 tablet by mouth 2 (two) times daily. 20 tablet Nyoka Lint, PA-C   fluticasone [facility]) 50 MCG/ACT nasal spray Place 1 spray into both nostrils daily. 16 g Nyoka Lint, PA-C   azithromycin (ZITHROMAX Z-PAK) 250 MG tablet 2 tablets initially and then 1 tablet daily until completed. 6 each Nyoka Lint, PA-C      PDMP not reviewed this encounter.   Nyoka Lint, PA-C 06/12/22 1008

## 2022-06-12 NOTE — ED Triage Notes (Signed)
Patient presents to UC for cough, sore throat since Tuesday. Congestion since yesterday. HA and sneezing since yesterday.  Bilateral ear pain since today. Taking alka-seltzer with no changes.   Denies fever or ear drainage.

## 2022-06-12 NOTE — Discharge Instructions (Signed)
Advised use of Flonase nasal spray, 2 sprays each nostril once daily to help decrease upper respiratory and sinus congestion. Advise use of Mucinex DM every 12 hours to help control cough and chest congestion. Advised take the Zithromax, 2 tablets initially today and then 1 tablet daily until completed to treat sinus infection. Advise use ibuprofen or Tylenol to help reduce pain and discomfort.  Advised follow-up PCP or return to urgent care if symptoms fail to improve.

## 2022-11-27 ENCOUNTER — Ambulatory Visit
Admission: EM | Admit: 2022-11-27 | Discharge: 2022-11-27 | Disposition: A | Discharge status: Home / Self Care | Payer: BC Managed Care – PPO | Attending: Internal Medicine | Admitting: Internal Medicine

## 2022-11-27 DIAGNOSIS — M7712 Lateral epicondylitis, left elbow: Secondary | ICD-10-CM | POA: Diagnosis not present

## 2022-11-27 DIAGNOSIS — Z98891 History of uterine scar from previous surgery: Secondary | ICD-10-CM | POA: Insufficient documentation

## 2022-11-27 MED ORDER — PREDNISONE 20 MG PO TABS: 40.0000 mg | ORAL_TABLET | Freq: Every day | ORAL | 0 refills | Status: AC

## 2022-11-27 NOTE — ED Triage Notes (Signed)
"  Pain started in left elbow extending down to hand and fingers are tingling". No injury known. "This has been about 3 weeks now". No visible redness "maybe swelling". No chest pain. No sob.

## 2022-11-27 NOTE — Discharge Instructions (Signed)
You have tennis elbow.  Please wear a brace but do not sleep in it.  I have prescribed prednisone to decrease inflammation.  Follow-up with orthopedic and sports medicine doctor if symptoms persist or worsen.

## 2022-11-27 NOTE — ED Provider Notes (Signed)
[name]    CSN: [id number] Arrival date & time: 11/27/22  0802      History   Chief Complaint Chief Complaint  Patient presents with   Left Elbow Pain    HPI Brenda Valencia is a 52 y.o. female.   Patient presents with left elbow pain that has been present for about 3 weeks.  Denies any injury.  Reports it is mainly present on the lateral portion of the elbow and is exacerbated by movement.  Reports that it feels like the pain is radiating down to her fingers and that she has been having some numbness of her index finger on the left side.  Denies chronic pain in this area.  Reports that she has taken a dose of ibuprofen with minimal improvement.  Reports that she does a lot of typing as repetitive movements on a daily basis as this is her job.     Past Medical History:  Diagnosis Date   Abnormal glucose tolerance in mother complicating pregnancy 10/19/2011   Elevated early 1hr gtt; normal 3hr gtt x2   Ectopic pregnancy    Treated with MTX   Fibroids    H/O varicella    Headache(784.0)    otc med prn   Rh negative status during pregnancy 10/19/2011   Seasonal allergies     Patient Active Problem List   Diagnosis Date Noted   History of cesarean section 11/27/2022   Persistent cough 07/14/2018   Vitamin D deficiency 02/24/2018   Other muscle spasm 02/24/2018   Awareness of heart beat 02/24/2018   Fatigue 02/24/2018   Abnormal glucose 02/24/2018   Menorrhagia 05/18/2014   Rh negative status during pregnancy 10/19/2011   Obesity 09/29/2011   Sterilization 09/29/2011   Breech presentation 09/29/2011   AMA (advanced maternal age) multigravida 35+ 03/19/2011   Penicillin allergy 03/19/2011   History of ectopic pregnancy 03/19/2011   Uterine leiomyoma 03/19/2011   Spotting in early pregnancy 02/23/2011    Past Surgical History:  Procedure Laterality Date   axillary nodes      2002   CESAREAN SECTION  07/2006   [facility]   CYSTOSCOPY  10/19/2011    Procedure: CYSTOSCOPY;  Surgeon: Esmeralda Arthur, MD;  Location: [facility];  Service: Gynecology;  Laterality: N/A;   TUBAL LIGATION     WISDOM TOOTH EXTRACTION     as teenager    OB History     Gravida  3   Para  2   Term  2   Preterm      AB  1   Living  2      SAB      IAB      Ectopic  1   Multiple      Live Births  2            Home Medications    Prior to Admission medications   Medication Sig Start Date End Date Taking? Authorizing Provider  cetirizine (ZYRTEC) 10 MG tablet Take 1 tablet (10 mg total) by mouth daily. 02/03/21  Yes Faige Seely, Rolly Salter E, FNP  fluticasone (FLONASE) 50 MCG/ACT nasal spray Place 1 spray into both nostrils daily. 06/12/22  Yes Ellsworth Lennox, PA-C  Multiple Vitamin (MULTIVITAMIN) tablet Take 1 tablet by mouth daily.   Yes [provider]  predniSONE (DELTASONE) 20 MG tablet Take 2 tablets (40 mg total) by mouth daily for 5 days. 11/27/22 12/02/22 Yes Murline Weigel, Acie Fredrickson, FNP  VITAMIN D, CHOLECALCIFEROL, PO  Take 1 tablet by mouth daily.   Yes [provider]  azithromycin (ZITHROMAX Z-PAK) 250 MG tablet 2 tablets initially and then 1 tablet daily until completed. 06/12/22   Ellsworth Lennox, PA-C  dextromethorphan-guaiFENesin [facility] DM) 30-600 MG 12hr tablet Take 1 tablet by mouth 2 (two) times daily. 02/03/21   Gustavus Bryant, FNP  dextromethorphan-guaiFENesin (MUCINEX DM) 30-600 MG 12hr tablet Take 1 tablet by mouth 2 (two) times daily. 06/12/22   Ellsworth Lennox, PA-C  MULTIPLE VITAMIN PO Take 1 tablet by mouth once.    [provider]  diphenhydrAMINE (BENADRYL) 25 MG tablet Take 25 mg by mouth every 6 (six) hours as needed.  11/26/19  [provider]    Family History Family History  Problem Relation Age of Onset   Hypertension Mother    Arthritis Mother    Cancer Mother    Heart disease Father    Hypertension Father    Alcohol abuse Father    Alcohol abuse Brother     Social History Social History    Tobacco Use   Smoking status: Never   Smokeless tobacco: Never  Vaping Use   Vaping status: Never Used  Substance Use Topics   Alcohol use: No   Drug use: No     Allergies   Penicillins   Review of Systems Review of Systems Per HPI  Physical Exam Triage Vital Signs ED Triage Vitals  Encounter Vitals Group     BP 11/27/22 0814 (!) 158/94     Systolic BP Percentile --      Diastolic BP Percentile --      Pulse Rate 11/27/22 0814 80     Resp 11/27/22 0814 18     Temp 11/27/22 0814 97.9 F (36.6 C)     Temp Source 11/27/22 0814 Oral     SpO2 11/27/22 0814 98 %     Weight 11/27/22 0811 220 lb (99.8 kg)     Height 11/27/22 0811 5\' 6"  (1.676 m)     Head Circumference --      Peak Flow --      Pain Score 11/27/22 0811 8     Pain Loc --      Pain Education --      Exclude from Growth Chart --    No data found.  Updated Vital Signs BP (!) 165/107 (BP Location: Right Arm)   Pulse 74   Temp 97.9 F (36.6 C) (Oral)   Resp 18   Ht 5\' 6"  (1.676 m)   Wt 220 lb (99.8 kg)   LMP 11/19/2022 (Exact Date)   SpO2 98%   BMI 35.51 kg/m   Visual Acuity Right Eye Distance:   Left Eye Distance:   Bilateral Distance:    Right Eye Near:   Left Eye Near:    Bilateral Near:     Physical Exam Constitutional:      General: She is not in acute distress.    Appearance: Normal appearance. She is not toxic-appearing or diaphoretic.  HENT:     Head: Normocephalic and atraumatic.  Eyes:     Extraocular Movements: Extraocular movements intact.     Conjunctiva/sclera: Conjunctivae normal.  Pulmonary:     Effort: Pulmonary effort is normal.  Musculoskeletal:     Comments: Patient has mild tenderness to palpation overlying the lateral portion of the left elbow.  No obvious swelling noted.  Pain occurs with range of motion in that area as well.  Patient has pain when  flexion and extension of the wrist is elicited.  Grip strength is 5/5.  Capillary refill and pulses are intact.  No  discoloration or warmth noted.  Full range of motion of elbow present.  Neurological:     General: No focal deficit present.     Mental Status: She is alert and oriented to person, place, and time. Mental status is at baseline.  Psychiatric:        Mood and Affect: Mood normal.        Behavior: Behavior normal.        Thought Content: Thought content normal.        Judgment: Judgment normal.      UC Treatments / Results  Labs (all labs ordered are listed, but only abnormal results are displayed) Labs Reviewed - No data to display  EKG   Radiology No results found.  Procedures Procedures (including critical care time)  Medications Ordered in UC Medications - No data to display  Initial Impression / Assessment and Plan / UC Course  I have reviewed the triage vital signs and the nursing notes.  Pertinent labs & imaging results that were available during my care of the patient were reviewed by me and considered in my medical decision making (see chart for details).     Physical exam is consistent with tennis elbow. Patient is neurovascularly intact on exam so suspect numbness of finger is due to tennis elbow and no worrisome etiology at this time. Do not think imaging is necessary given no direct bony tenderness or injury.  Tennis elbow brace applied in urgent care by clinical staff.  Advised to take this off while sleeping.  Advised supportive care.  Advised to avoid repetitive movements if possible.  Will avoid NSAIDs given patient's history of anemia.  Patient has been prescribed prednisone previously and tolerated well so will do steroid burst to see if this will alleviate patient's discomfort.  Advised patient to follow-up with orthopedist at provided contact information if symptoms persist or worsen.  Blood pressure is elevated but suspect this is due to pain.  Advised patient to monitor blood pressure at home and follow-up with PCP or urgent care if it remains elevated.   Patient verbalized understanding and was agreeable with plan. Final Clinical Impressions(s) / UC Diagnoses   Final diagnoses:  Lateral epicondylitis of left elbow     Discharge Instructions      You have tennis elbow.  Please wear a brace but do not sleep in it.  I have prescribed prednisone to decrease inflammation.  Follow-up with orthopedic and sports medicine doctor if symptoms persist or worsen.    ED Prescriptions     Medication Sig Dispense Auth. Provider   predniSONE (DELTASONE) 20 MG tablet Take 2 tablets (40 mg total) by mouth daily for 5 days. 10 tablet Gustavus Bryant, [place]      PDMP not reviewed this encounter.   Gustavus Bryant, [place] 11/27/22 [phone]

## 2023-07-13 ENCOUNTER — Encounter (HOSPITAL_COMMUNITY): Payer: Self-pay | Admitting: Radiology

## 2023-07-13 ENCOUNTER — Observation Stay (HOSPITAL_COMMUNITY)
Admission: EM | Admit: 2023-07-13 | Discharge: 2023-07-15 | Disposition: A | Discharge status: Home / Self Care | Payer: BC Managed Care – PPO | Attending: Internal Medicine | Admitting: Internal Medicine

## 2023-07-13 ENCOUNTER — Emergency Department (HOSPITAL_COMMUNITY): Payer: BC Managed Care – PPO

## 2023-07-13 ENCOUNTER — Observation Stay (HOSPITAL_BASED_OUTPATIENT_CLINIC_OR_DEPARTMENT_OTHER): Payer: BC Managed Care – PPO

## 2023-07-13 DIAGNOSIS — E049 Nontoxic goiter, unspecified: Secondary | ICD-10-CM | POA: Diagnosis not present

## 2023-07-13 DIAGNOSIS — Z86718 Personal history of other venous thrombosis and embolism: Secondary | ICD-10-CM | POA: Insufficient documentation

## 2023-07-13 DIAGNOSIS — I428 Other cardiomyopathies: Secondary | ICD-10-CM | POA: Diagnosis not present

## 2023-07-13 DIAGNOSIS — R000 Tachycardia, unspecified: Principal | ICD-10-CM | POA: Diagnosis present

## 2023-07-13 DIAGNOSIS — I160 Hypertensive urgency: Secondary | ICD-10-CM | POA: Insufficient documentation

## 2023-07-13 DIAGNOSIS — R7989 Other specified abnormal findings of blood chemistry: Secondary | ICD-10-CM | POA: Insufficient documentation

## 2023-07-13 DIAGNOSIS — Z8616 Personal history of COVID-19: Secondary | ICD-10-CM | POA: Insufficient documentation

## 2023-07-13 DIAGNOSIS — Z79899 Other long term (current) drug therapy: Secondary | ICD-10-CM | POA: Diagnosis not present

## 2023-07-13 DIAGNOSIS — R918 Other nonspecific abnormal finding of lung field: Secondary | ICD-10-CM | POA: Diagnosis not present

## 2023-07-13 DIAGNOSIS — R590 Localized enlarged lymph nodes: Secondary | ICD-10-CM | POA: Diagnosis not present

## 2023-07-13 DIAGNOSIS — R002 Palpitations: Secondary | ICD-10-CM | POA: Diagnosis not present

## 2023-07-13 LAB — CBC WITH DIFFERENTIAL/PLATELET
Abs Immature Granulocytes: 0.09 10*3/uL — ABNORMAL HIGH (ref 0.00–0.07)
Basophils Absolute: 0 10*3/uL (ref 0.0–0.1)
Basophils Relative: 1 %
Eosinophils Absolute: 0.1 10*3/uL (ref 0.0–0.5)
Eosinophils Relative: 3 %
HCT: 45.1 % (ref 36.0–46.0)
Hemoglobin: 14.4 g/dL (ref 12.0–15.0)
Immature Granulocytes: 2 %
Lymphocytes Relative: 22 %
Lymphs Abs: 1.2 10*3/uL (ref 0.7–4.0)
MCH: 28.1 pg (ref 26.0–34.0)
MCHC: 31.9 g/dL (ref 30.0–36.0)
MCV: 87.9 fL (ref 80.0–100.0)
Monocytes Absolute: 0.7 10*3/uL (ref 0.1–1.0)
Monocytes Relative: 12 %
Neutro Abs: 3.3 10*3/uL (ref 1.7–7.7)
Neutrophils Relative %: 60 %
Platelets: 285 10*3/uL (ref 150–400)
RBC: 5.13 MIL/uL — ABNORMAL HIGH (ref 3.87–5.11)
RDW: 13.3 % (ref 11.5–15.5)
WBC: 5.5 10*3/uL (ref 4.0–10.5)
nRBC: 0 % (ref 0.0–0.2)

## 2023-07-13 LAB — BASIC METABOLIC PANEL
Anion gap: 9 (ref 5–15)
BUN: 9 mg/dL (ref 6–20)
CO2: 24 mmol/L (ref 22–32)
Calcium: 9.6 mg/dL (ref 8.9–10.3)
Chloride: 105 mmol/L (ref 98–111)
Creatinine, Ser: 0.73 mg/dL (ref 0.44–1.00)
GFR, Estimated: >60 mL/min (ref >60)
Glucose, Bld: 160 mg/dL — ABNORMAL HIGH (ref 70–99)
Potassium: 3.7 mmol/L (ref 3.5–5.1)
Sodium: 138 mmol/L (ref 135–145)

## 2023-07-13 LAB — ECHOCARDIOGRAM COMPLETE
AR max vel: 2.59 cm2
AV Area VTI: 3.18 cm2
AV Area mean vel: 2.79 cm2
AV Mean grad: 3 mm[Hg]
AV Peak grad: 5.1 mm[Hg]
Ao pk vel: 1.13 m/s
Area-P 1/2: 4.6 cm2
Calc EF: 69.5 %
MV VTI: 3.47 cm2
S' Lateral: 2.8 cm
Single Plane A2C EF: 69.5 %
Single Plane A4C EF: 66.1 %

## 2023-07-13 LAB — TROPONIN I (HIGH SENSITIVITY)
Troponin I (High Sensitivity): 118 ng/L (ref <18)
Troponin I (High Sensitivity): 185 ng/L (ref <18)

## 2023-07-13 LAB — RESP PANEL BY RT-PCR (RSV, FLU A&B, COVID)  RVPGX2
Influenza A by PCR: NEGATIVE
Influenza B by PCR: NEGATIVE
Resp Syncytial Virus by PCR: NEGATIVE
SARS Coronavirus 2 by RT PCR: NEGATIVE

## 2023-07-13 LAB — TSH: TSH: 1.403 u[IU]/mL (ref 0.350–4.500)

## 2023-07-13 LAB — HIV ANTIBODY (ROUTINE TESTING W REFLEX): HIV Screen 4th Generation wRfx: NONREACTIVE

## 2023-07-13 MED ORDER — ACETAMINOPHEN 325 MG PO TABS
650.0000 mg | ORAL_TABLET | Freq: Four times a day (QID) | ORAL | Status: DC | PRN
  Administered 2023-07-14: 650 mg via ORAL
  Filled 2023-07-13: qty 2

## 2023-07-13 MED ORDER — SODIUM CHLORIDE (PF) 0.9 % IJ SOLN
INTRAMUSCULAR | Status: AC
Start: 2023-07-13 — End: ?
  Filled 2023-07-13: qty 50

## 2023-07-13 MED ORDER — SODIUM CHLORIDE 0.9 % IV SOLN: INTRAVENOUS | Status: DC

## 2023-07-13 MED ORDER — ADULT MULTIVITAMIN W/MINERALS CH
1.0000 | ORAL_TABLET | Freq: Every day | ORAL | Status: DC
  Administered 2023-07-13 – 2023-07-15 (×3): 1 via ORAL
  Filled 2023-07-13 (×3): qty 1

## 2023-07-13 MED ORDER — IOHEXOL 350 MG/ML SOLN
75.0000 mL | Freq: Once | INTRAVENOUS | Status: AC | PRN
  Administered 2023-07-13: 80 mL via INTRAVENOUS

## 2023-07-13 MED ORDER — CARVEDILOL 12.5 MG PO TABS
12.5000 mg | ORAL_TABLET | Freq: Two times a day (BID) | ORAL | Status: DC
  Administered 2023-07-13 – 2023-07-14 (×2): 12.5 mg via ORAL
  Filled 2023-07-13 (×2): qty 1

## 2023-07-13 MED ORDER — ACETAMINOPHEN 650 MG RE SUPP: 650.0000 mg | Freq: Four times a day (QID) | RECTAL | Status: DC | PRN

## 2023-07-13 MED ORDER — DM-GUAIFENESIN ER 30-600 MG PO TB12
1.0000 | ORAL_TABLET | Freq: Two times a day (BID) | ORAL | Status: DC
  Administered 2023-07-13 – 2023-07-15 (×4): 1 via ORAL
  Filled 2023-07-13 (×4): qty 1

## 2023-07-13 MED ORDER — ONDANSETRON HCL 4 MG PO TABS: 4.0000 mg | ORAL_TABLET | Freq: Four times a day (QID) | ORAL | Status: DC | PRN

## 2023-07-13 MED ORDER — ONDANSETRON HCL 4 MG/2ML IJ SOLN
4.0000 mg | Freq: Four times a day (QID) | INTRAMUSCULAR | Status: DC | PRN
  Administered 2023-07-15: 4 mg via INTRAVENOUS
  Filled 2023-07-13: qty 2

## 2023-07-13 MED ORDER — NICARDIPINE HCL IN NACL 20-0.86 MG/200ML-% IV SOLN: 3.0000 mg/h | INTRAVENOUS | Status: DC

## 2023-07-13 MED ORDER — ENOXAPARIN SODIUM 40 MG/0.4ML IJ SOSY
40.0000 mg | PREFILLED_SYRINGE | INTRAMUSCULAR | Status: DC
  Administered 2023-07-13 – 2023-07-14 (×2): 40 mg via SUBCUTANEOUS
  Filled 2023-07-13 (×2): qty 0.4

## 2023-07-13 MED ORDER — FLUTICASONE PROPIONATE 50 MCG/ACT NA SUSP
1.0000 | Freq: Every day | NASAL | Status: DC
  Administered 2023-07-13 – 2023-07-15 (×3): 1 via NASAL
  Filled 2023-07-13: qty 16

## 2023-07-13 MED ORDER — AMLODIPINE BESYLATE 10 MG PO TABS
10.0000 mg | ORAL_TABLET | Freq: Every day | ORAL | Status: DC
  Administered 2023-07-13 – 2023-07-15 (×3): 10 mg via ORAL
  Filled 2023-07-13: qty 1
  Filled 2023-07-13 (×2): qty 2

## 2023-07-13 MED ORDER — SODIUM CHLORIDE 0.9 % IV SOLN: INTRAVENOUS | Status: AC

## 2023-07-13 NOTE — ED Triage Notes (Signed)
 Pt states that at 0200 she woke up to use the bathroom and began having palpitations. She states that she felt dizzy at that time, but denies CP and SOB. No diaphoresis. Pt denies cardiac hx.

## 2023-07-13 NOTE — ED Provider Notes (Signed)
 [place] EMERGENCY DEPARTMENT AT [facility] Provider Note   CSN: [id number] Arrival date & time: 07/13/23  8295     History  Chief Complaint  Patient presents with   Palpitations    Yuleidy Rappleye Scott-McCray is a 53 y.o. female.  53 year old female presents with tachycardia which began this morning.  Remote history of DVT due to COVID but was never on anticoagulants.  Denies any history of PE.  States that she has not had any chest pain or chest pressure.  No leg pain or swelling.  Denies any exertional dyspnea.  No history of thyroid disorders.  No recent fever or volume loss.  Symptoms seem to be worse with exertion and no treatment use prior to arrival       Home Medications Prior to Admission medications   Medication Sig Start Date End Date Taking? Authorizing Provider  azithromycin (ZITHROMAX Z-PAK) 250 MG tablet 2 tablets initially and then 1 tablet daily until completed. 06/12/22   Ellsworth Lennox, PA-C  cetirizine (ZYRTEC) 10 MG tablet Take 1 tablet (10 mg total) by mouth daily. 02/03/21   Gustavus Bryant, FNP  dextromethorphan-guaiFENesin (MUCINEX DM) 30-600 MG 12hr tablet Take 1 tablet by mouth 2 (two) times daily. 02/03/21   Gustavus Bryant, FNP  dextromethorphan-guaiFENesin (MUCINEX DM) 30-600 MG 12hr tablet Take 1 tablet by mouth 2 (two) times daily. 06/12/22   Ellsworth Lennox, PA-C  fluticasone [facility]) 50 MCG/ACT nasal spray Place 1 spray into both nostrils daily. 06/12/22   Ellsworth Lennox, PA-C  Multiple Vitamin (MULTIVITAMIN) tablet Take 1 tablet by mouth daily.    [provider]  MULTIPLE VITAMIN PO Take 1 tablet by mouth once.    [provider]  VITAMIN D, CHOLECALCIFEROL, PO Take 1 tablet by mouth daily.    [provider]  diphenhydrAMINE (BENADRYL) 25 MG tablet Take 25 mg by mouth every 6 (six) hours as needed.  11/26/19  [provider]      Allergies    Penicillins    Review of Systems   Review of Systems  All other  systems reviewed and are negative.   Physical Exam Updated Vital Signs BP (!) 163/100   Pulse 98   Temp 98.2 F (36.8 C) (Oral)   Resp 20   SpO2 100%  Physical Exam Vitals and nursing note reviewed.  Constitutional:      General: She is not in acute distress.    Appearance: Normal appearance. She is well-developed. She is not toxic-appearing.  HENT:     Head: Normocephalic and atraumatic.  Eyes:     General: Lids are normal.     Conjunctiva/sclera: Conjunctivae normal.     Pupils: Pupils are equal, round, and reactive to light.  Neck:     Thyroid: No thyroid mass.     Trachea: No tracheal deviation.  Cardiovascular:     Rate and Rhythm: Regular rhythm. Tachycardia present.     Heart sounds: Normal heart sounds. No murmur heard.    No gallop.  Pulmonary:     Effort: Pulmonary effort is normal. No respiratory distress.     Breath sounds: Normal breath sounds. No stridor. No decreased breath sounds, wheezing, rhonchi or rales.  Abdominal:     General: There is no distension.     Palpations: Abdomen is soft.     Tenderness: There is no abdominal tenderness. There is no rebound.  Musculoskeletal:        General: No tenderness. Normal range of  motion.     Cervical back: Normal range of motion and neck supple.  Skin:    General: Skin is warm and dry.     Findings: No abrasion or rash.  Neurological:     Mental Status: She is alert and oriented to person, place, and time. Mental status is at baseline.     GCS: GCS eye subscore is 4. GCS verbal subscore is 5. GCS motor subscore is 6.     Cranial Nerves: No cranial nerve deficit.     Sensory: No sensory deficit.     Motor: Motor function is intact.  Psychiatric:        Attention and Perception: Attention normal.        Speech: Speech normal.        Behavior: Behavior normal.     ED Results / Procedures / Treatments   Labs (all labs ordered are listed, but only abnormal results are displayed) Labs Reviewed  BASIC  METABOLIC PANEL - Abnormal; Notable for the following components:      Result Value   Glucose, Bld 160 (*)    All other components within normal limits  CBC WITH DIFFERENTIAL/PLATELET - Abnormal; Notable for the following components:   RBC 5.13 (*)    Abs Immature Granulocytes 0.09 (*)    All other components within normal limits  TROPONIN I (HIGH SENSITIVITY) - Abnormal; Notable for the following components:   Troponin I (High Sensitivity) 118 (*)    All other components within normal limits  RESP PANEL BY RT-PCR (RSV, FLU A&B, COVID)  RVPGX2  TSH  TROPONIN I (HIGH SENSITIVITY)    EKG EKG Interpretation Date/Time:  Tuesday July 13 2023 08:13:45 EST Ventricular Rate:  118 PR Interval:  169 QRS Duration:  86 QT Interval:  332 QTC Calculation: 466 R Axis:   76  Text Interpretation: Sinus tachycardia Borderline T wave abnormalities Baseline wander in lead(s) V1 V2 Confirmed by Anders Simmonds [phone]) on 07/13/2023 8:24:31 AM  Radiology No results found.  Procedures Procedures    Medications Ordered in ED Medications - No data to display  ED Course/ Medical Decision Making/ A&P                                 Medical Decision Making Amount and/or Complexity of Data Reviewed Radiology: ordered.  Risk Prescription drug management.   Patient here with palpitations and EKG per my interpretation shows signs tachycardia.  Troponin came back elevated at 118 and then second troponin chemic elevated when he 5. concern for possible pulmonary embolus and CT angio chest showed no evidence of PE but did show some hilar adenopathy.  COVID and flu test negative.  Patient's TSH is normal.  She has no cardiac symptoms at this time.  Discussed with Dr. Mayford Knife and Dr. Francee Piccolo from cardiology.  Recommend patient have echocardiogram.  They will see in consultation and patient to be admitted to the medicine service.  CRITICAL CARE Performed by: Toy Baker Total critical care time:  45 minutes Critical care time was exclusive of separately billable procedures and treating other patients. Critical care was necessary to treat or prevent imminent or life-threatening deterioration. Critical care was time spent personally by me on the following activities: development of treatment plan with patient and/or surrogate as well as nursing, discussions with consultants, evaluation of patient's response to treatment, examination of patient, obtaining history from patient or surrogate, ordering and  performing treatments and interventions, ordering and review of laboratory studies, ordering and review of radiographic studies, pulse oximetry and re-evaluation of patient's condition.         Final Clinical Impression(s) / ED Diagnoses Final diagnoses:  None    Rx / DC Orders ED Discharge Orders     None         Lorre Nick, MD 07/13/23 1404

## 2023-07-13 NOTE — ED Notes (Signed)
Hospitalist to bedside.

## 2023-07-13 NOTE — Progress Notes (Signed)
  Echocardiogram 2D Echocardiogram has been performed.  Ocie Doyne RDCS 07/13/2023, 3:47 PM

## 2023-07-13 NOTE — ED Provider Triage Note (Signed)
 Emergency Medicine Provider Triage Evaluation Note  Brenda Valencia , a 53 y.o. female  was evaluated in triage.  Pt complains of waking up this morning noticing rapid heart rate.  Patient does not regularly seek medical care.  Patient says that she woke up, felt as though her heart rate was fast, went to the bathroom noticed that her pulse in her neck was moving very quickly.  She otherwise felt fine when she went to bed yesterday.  Patient denies any chest pain or shortness of breath, however she says that when she stood up, she felt kind of an allover body heaviness.  She denies any fluctuations in her weight, does endorse sweating at night, but states that this has been ongoing for several years.  Review of Systems  Positive:  Negative:   Physical Exam  BP (!) 213/110 (BP Location: Left Arm)   Pulse (!) 115   Temp 97.9 F (36.6 C)   Resp 16   SpO2 100%  Gen:   Awake, no distress   Resp:  Normal effort  MSK:   Moves extremities without difficulty  Other:  CV-tachycardia, no murmur  Medical Decision Making  Medically screening exam initiated at 8:22 AM.  Appropriate orders placed.  Brenda Valencia was informed that the remainder of the evaluation will be completed by another provider, this initial triage assessment does not replace that evaluation, and the importance of remaining in the ED until their evaluation is complete.  53 year old female here today for tachycardia.  Overall looks well.  Sinus rhythm on EKG.   Anders Simmonds T, DO 07/13/23 [phone]

## 2023-07-13 NOTE — ED Notes (Signed)
 Pt to CT

## 2023-07-13 NOTE — Consult Note (Signed)
 Cardiology Consultation   Patient ID: Brenda Valencia MRN: [id number]; DOB: 18-Jul-1970  Admit date: 07/13/2023 Date of Consult: 07/13/2023  PCP:  Pcp, No   [facility] Providers Cardiologist:  None        Patient Profile:   Brenda Valencia is a 53 y.o. female with a hx of DVT possibly related to COVID, not treated with [facility] who is being seen 07/13/2023 for the evaluation of sinus tachycardia and elevated troponin at the request of Dr. Freida Busman.  History of Present Illness:   Ms. Brenda Valencia presents with symptoms of tachycardia.  She noted a remote DVT possibly related to COVID in the past.  She was not managed with anticoagulation.  She is not having any substernal chest pain.  She has no history of significant coronary vascular disease risk factors or history of coronary disease.  She is overweight.  Here she has sinus tachycardia, and elevated troponin 118->185.  Her EKG did not show any ischemic changes.  Cardiology was consulted and the on-call doc recommended an echocardiogram.  TSH is normal.  Her respiratory viral panel is normal.  Her hemoglobin is normal.  Potassium was 3.7.  Creatinine is normal.  Her CT was negative for PE but did show multiple solid noncalcified pulmonary nodules predominantly in peribronchovascular location mainly in the upper lobes and hilar lymphadenopathy the differential diagnosis include sarcoidosis, lymphoproliferative disease, metastasis.   Past Medical History:  Diagnosis Date   Abnormal glucose tolerance in mother complicating pregnancy 10/19/2011   Elevated early 1hr gtt; normal 3hr gtt x2   Ectopic pregnancy    Treated with MTX   Fibroids    H/O varicella    Headache(784.0)    otc med prn   Rh negative status during pregnancy 10/19/2011   Seasonal allergies     Past Surgical History:  Procedure Laterality Date   axillary nodes      2002   CESAREAN SECTION  07/2006   NRFHR   CYSTOSCOPY  10/19/2011   Procedure: CYSTOSCOPY;   Surgeon: Esmeralda Arthur, MD;  Location: [facility];  Service: Gynecology;  Laterality: N/A;   TUBAL LIGATION     WISDOM TOOTH EXTRACTION     as teenager     Home Medications:  Prior to Admission medications   Medication Sig Start Date End Date Taking? Authorizing Provider  azithromycin (ZITHROMAX Z-PAK) 250 MG tablet 2 tablets initially and then 1 tablet daily until completed. 06/12/22   Ellsworth Lennox, PA-C  cetirizine (ZYRTEC) 10 MG tablet Take 1 tablet (10 mg total) by mouth daily. 02/03/21   Gustavus Bryant, FNP  dextromethorphan-guaiFENesin (MUCINEX DM) 30-600 MG 12hr tablet Take 1 tablet by mouth 2 (two) times daily. 02/03/21   Gustavus Bryant, FNP  dextromethorphan-guaiFENesin (MUCINEX DM) 30-600 MG 12hr tablet Take 1 tablet by mouth 2 (two) times daily. 06/12/22   Ellsworth Lennox, PA-C  fluticasone [facility]) 50 MCG/ACT nasal spray Place 1 spray into both nostrils daily. 06/12/22   Ellsworth Lennox, PA-C  Multiple Vitamin (MULTIVITAMIN) tablet Take 1 tablet by mouth daily.    [provider]  MULTIPLE VITAMIN PO Take 1 tablet by mouth once.    [provider]  VITAMIN D, CHOLECALCIFEROL, PO Take 1 tablet by mouth daily.    [provider]  diphenhydrAMINE (BENADRYL) 25 MG tablet Take 25 mg by mouth every 6 (six) hours as needed.  11/26/19  [provider]    Inpatient Medications: Scheduled Meds:  Continuous Infusions:  PRN Meds:   Allergies:    Allergies  Allergen Reactions   Penicillins Hives, Nausea And Vomiting and Nausea Only    Social History:   Social History   Socioeconomic History   Marital status: Married    Spouse name: Not on file   Number of children: Not on file   Years of education: Not on file   Highest education level: Not on file  Occupational History   Not on file  Tobacco Use   Smoking status: Never   Smokeless tobacco: Never  Vaping Use   Vaping status: Never Used  Substance and Sexual Activity   Alcohol use: No   Drug  use: No   Sexual activity: Yes    Birth control/protection: Surgical    Comment: btl  Other Topics Concern   Not on file  Social History Narrative   Not on file   Social Drivers of Health   Financial Resource Strain: Not on file  Food Insecurity: Not on file  Transportation Needs: Not on file  Physical Activity: Not on file  Stress: Not on file  Social Connections: Not on file  Intimate Partner Violence: Not on file    Family History:    Family History  Problem Relation Age of Onset   Hypertension Mother    Arthritis Mother    Cancer Mother    Heart disease Father    Hypertension Father    Alcohol abuse Father    Alcohol abuse Brother      ROS:  Please see the history of present illness.  All other ROS reviewed and negative.     Physical Exam/Data:   Vitals:   07/13/23 1039 07/13/23 1130 07/13/23 1215 07/13/23 1315  BP:  (!) 156/96 (!) 161/93 (!) 163/96  Pulse:  96 82 92  Resp:  17 19 17   Temp: 98.2 F (36.8 C)     TempSrc: Oral     SpO2:  99% 98% 100%   No intake or output data in the 24 hours ending 07/13/23 1407    11/27/2022    8:11 AM 07/14/2018   10:31 AM 06/08/2018   10:02 AM  Last 3 Weights  Weight (lbs) 220 lb 223 lb 3.2 oz 225 lb  Weight (kg) 99.791 kg 101.243 kg 102.059 kg     There is no height or weight on file to calculate BMI.  General:  Well nourished, well developed, in no acute distress HEENT: normal Neck: no JVD Vascular: No carotid bruits; Distal pulses 2+ bilaterally Cardiac:  normal S1, S2; tachycardic; no murmur  Lungs:  clear to auscultation bilaterally, no wheezing, rhonchi or rales  Abd: soft, nontender, no hepatomegaly  Ext: no edema Musculoskeletal:  No deformities, BUE and BLE strength normal and equal Skin: warm and dry  Neuro:  CNs 2-12 intact, no focal abnormalities noted Psych:  Normal affect   EKG:  The EKG was personally reviewed and demonstrates: Sinus tachycardia  Telemetry:  Telemetry was personally reviewed  and demonstrates: Sinus tachycardia  Relevant CV Studies: TTE is pending     Laboratory Data:  High Sensitivity Troponin:   Recent Labs  Lab 07/13/23 0827 07/13/23 1045  TROPONINIHS 118* 185*     Chemistry Recent Labs  Lab 07/13/23 0827  NA 138  K 3.7  CL 105  CO2 24  GLUCOSE 160*  BUN 9  CREATININE 0.73  CALCIUM 9.6  GFRNONAA >60  ANIONGAP 9    No results for input(s): "PROT", "ALBUMIN", "AST", "ALT", "  ALKPHOS", "BILITOT" in the last 168 hours. Lipids No results for input(s): "CHOL", "TRIG", "HDL", "LABVLDL", "LDLCALC", "CHOLHDL" in the last 168 hours.  Hematology Recent Labs  Lab 07/13/23 0827  WBC 5.5  RBC 5.13*  HGB 14.4  HCT 45.1  MCV 87.9  MCH 28.1  MCHC 31.9  RDW 13.3  PLT 285   Thyroid  Recent Labs  Lab 07/13/23 0827  TSH 1.403    BNPNo results for input(s): "BNP", "PROBNP" in the last 168 hours.  DDimer No results for input(s): "DDIMER" in the last 168 hours.   Radiology/Studies:  CT Angio Chest PE W/Cm &/Or Wo Cm Result Date: 07/13/2023 CLINICAL DATA:  Pulmonary embolism (PE) suspected, high prob. Palpitations. Heaviness. EXAM: CT ANGIOGRAPHY CHEST WITH CONTRAST TECHNIQUE: Multidetector CT imaging of the chest was performed using the standard protocol during bolus administration of intravenous contrast. Multiplanar CT image reconstructions and MIPs were obtained to evaluate the vascular anatomy. RADIATION DOSE REDUCTION: This exam was performed according to the departmental dose-optimization program which includes automated exposure control, adjustment of the mA and/or kV according to patient size and/or use of iterative reconstruction technique. CONTRAST:  80mL OMNIPAQUE IOHEXOL 350 MG/ML SOLN COMPARISON:  None Available. FINDINGS: Cardiovascular: No evidence of embolism to the proximal subsegmental pulmonary artery level. Normal cardiac size. No pericardial effusion. No aortic aneurysm. Mediastinum/Nodes: Enlarged and heterogeneous thyroid gland  however without discrete nodule, not well evaluated on this exam. No solid / cystic mediastinal masses. The esophagus is nondistended precluding optimal assessment. There are innumerable enlarged mediastinal and hilar lymph nodes with largest in the subcarinal region measuring up to 1.9 x 2.4 cm and largest in the left hilum measuring up to 1.4 x 1.9 cm. No axillary lymphadenopathy by size criteria. Lungs/Pleura: The central tracheo-bronchial tree is patent. There are multiple solid noncalcified pulmonary nodules ranging in size from few mm up to 1.8 cm, predominantly in peribronchovascular location and mainly the upper lobes. No pleural effusion or pneumothorax. Upper Abdomen: Visualized upper abdominal viscera within normal limits. Musculoskeletal: The visualized soft tissues of the chest wall are grossly unremarkable. No suspicious osseous lesions. There are mild multilevel degenerative changes in the visualized spine. Review of the MIP images confirms the above findings. IMPRESSION: 1. No embolism to the proximal subsegmental pulmonary artery level. 2. Multiple solid noncalcified pulmonary nodules, predominantly in peribronchovascular location and mainly in the upper lobes. There is associated mediastinal and hilar lymphadenopathy. Differential diagnosis includes sarcoidosis, lymphoproliferative disease, metastases, etc. 3. Multiple other nonacute observations, as described above. Electronically Signed   By: Jules Schick M.D.   On: 07/13/2023 13:58     Assessment and Plan:   Sinus tachycardia Elevated troponin DDx includes ischemia versus PE.  Her non-gated CT did not show significant CAC.  She does not have significant risk factors for coronary disease.  I have low suspicion that this is an NSTEMI, suspect likely demand.  Likely also elevated in the setting of hypertensive urgency.  Her CT was negative for PE.  We will follow-up her echocardiogram.  Starting a beta-blocker  Noncalcified pulmonary  nodules  She did have multiple noncalcified pulmonary nodules the differential includes sarcoidosis, lymphoproliferative disease, metastasis.  Recommend a pulmonology consult  Hypertensive urgency Blood pressure is significantly elevated to the 200s -Started nicardipine drip -Will start a beta-blocker and Norvasc  Risk Assessment/Risk Scores:       For questions or updates, please contact  [facility] Please consult [email or web address] for contact info under    Signed, Carole Doner,  Alben Spittle, MD  07/13/2023 2:07 PM

## 2023-07-13 NOTE — H&P (Signed)
 History and Physical    Patient: Brenda Valencia:[id number] DOB: [birth year] DOA: 07/13/2023 DOS: the patient was seen and examined on 07/13/2023 PCP: Pcp, No  Patient coming from: Home  Chief Complaint:  Chief Complaint  Patient presents with   Palpitations   HPI: Brenda Valencia is a 53 y.o. female with medical history significant of seasonal allergies, presents to ED for tachycardia. She reports having upper respiratory tract infection the first week of February, and now has a lingering cough. She denies any fevers, chills, chest pain, sob , chest tightness, nausea, vomiting and abdominal pain. She denies any urinary complaints.  She denies any family h/o of Heart disease.    ED work up . On arrival she is afebrile, tachycardic upto 115/min, hypertensive with BP of 213/187mmhg.  Labs reveal cbg of 160, troponin of 118 and 185.  TSH wnl.  Respiratory panel is negative.  COVID PCR is negative.  CT angio is negative for PE, but showed hilar and mediastinal lymphadenopathy.  EKG shows non specific t wave abnormalities.    She was referred to [facility] for evaluation of tachycardia. Cardiology consulted.    Review of Systems: As mentioned in the history of present illness. All other systems reviewed and are negative. Past Medical History:  Diagnosis Date   Abnormal glucose tolerance in mother complicating pregnancy 10/19/2011   Elevated early 1hr gtt; normal 3hr gtt x2   Ectopic pregnancy    Treated with MTX   Fibroids    H/O varicella    Headache(784.0)    otc med prn   Rh negative status during pregnancy 10/19/2011   Seasonal allergies    Past Surgical History:  Procedure Laterality Date   axillary nodes      2002   CESAREAN SECTION  07/2006   NRFHR   CYSTOSCOPY  10/19/2011   Procedure: CYSTOSCOPY;  Surgeon: Esmeralda Arthur, MD;  Location: [facility];  Service: Gynecology;  Laterality: N/A;   TUBAL LIGATION     WISDOM TOOTH EXTRACTION     as teenager   Social  History:  reports that she has never smoked. She has never used smokeless tobacco. She reports that she does not drink alcohol and does not use drugs.  Allergies  Allergen Reactions   Penicillins Hives, Nausea And Vomiting and Nausea Only    Family History  Problem Relation Age of Onset   Hypertension Mother    Arthritis Mother    Cancer Mother    Heart disease Father    Hypertension Father    Alcohol abuse Father    Alcohol abuse Brother     Prior to Admission medications   Medication Sig Start Date End Date Taking? Authorizing Provider  acetaminophen (TYLENOL) 500 MG tablet Take 1,000 mg by mouth every 6 (six) hours as needed for mild pain (pain score 1-3), moderate pain (pain score 4-6) or fever.   Yes [provider]  Cyanocobalamin (VITAMIN B-12 PO) Take 1 tablet by mouth daily.   Yes [provider]  azithromycin (ZITHROMAX Z-PAK) 250 MG tablet 2 tablets initially and then 1 tablet daily until completed. Patient not taking: Reported on 07/13/2023 06/12/22   Ellsworth Lennox, PA-C  cetirizine (ZYRTEC) 10 MG tablet Take 1 tablet (10 mg total) by mouth daily. 02/03/21  Yes [place], Rolly Salter E, FNP  fluticasone (FLONASE) 50 MCG/ACT nasal spray Place 1 spray into both nostrils daily. Patient taking differently: Place 1 spray into both nostrils as needed for allergies. 06/12/22  Yes  Ellsworth Lennox, PA-C  Multiple Vitamin (MULTIVITAMIN) tablet Take 1 tablet by mouth daily.   Yes [provider]  VITAMIN D, CHOLECALCIFEROL, PO Take 1 tablet by mouth daily.   Yes [provider]  diphenhydrAMINE (BENADRYL) 25 MG tablet Take 25 mg by mouth every 6 (six) hours as needed.  11/26/19  [provider]    Physical Exam: Vitals:   07/13/23 1456 07/13/23 1600 07/13/23 1630 07/13/23 1710  BP:  (!) 160/89 (!) 152/96 (!) 167/98  Pulse:  87 92 95  Resp:  17 18 16   Temp: 98.6 F (37 C)   98.2 F (36.8 C)  TempSrc: Oral     SpO2:  100% 100% 99%   General exam:  Appears calm and comfortable  Respiratory system: Clear to auscultation. Respiratory effort normal. Cardiovascular system: S1 & S2 heard, RRR. No JVD, murmurs,  Gastrointestinal system: Abdomen is nondistended, soft and nontender. No organomegaly or masses felt. Normal bowel sounds heard. Central nervous system: Alert and oriented. No focal neurological deficits. Extremities: Symmetric 5 x 5 power. Skin: No rashes, lesions or ulcers Psychiatry:  Mood & affect appropriate.   Data Reviewed: Results for orders placed or performed during the hospital encounter of 07/13/23 (from the past 24 hours)  Basic metabolic panel     Status: Abnormal   Collection Time: 07/13/23  8:27 AM  Result Value Ref Range   Sodium 138 135 - 145 mmol/L   Potassium 3.7 3.5 - 5.1 mmol/L   Chloride 105 98 - 111 mmol/L   CO2 24 22 - 32 mmol/L   Glucose, Bld 160 (H) 70 - 99 mg/dL   BUN 9 6 - 20 mg/dL   Creatinine, Ser 1.61 0.44 - 1.00 mg/dL   Calcium 9.6 8.9 - 09.6 mg/dL   GFR, Estimated >04 >54 mL/min   Anion gap 9 5 - 15  CBC with Differential     Status: Abnormal   Collection Time: 07/13/23  8:27 AM  Result Value Ref Range   WBC 5.5 4.0 - 10.5 K/uL   RBC 5.13 (H) 3.87 - 5.11 MIL/uL   Hemoglobin 14.4 12.0 - 15.0 g/dL   HCT 09.8 11.9 - 14.7 %   MCV 87.9 80.0 - 100.0 fL   MCH 28.1 26.0 - 34.0 pg   MCHC 31.9 30.0 - 36.0 g/dL   RDW 82.9 56.2 - 13.0 %   Platelets 285 150 - 400 K/uL   nRBC 0.0 0.0 - 0.2 %   Neutrophils Relative % 60 %   Neutro Abs 3.3 1.7 - 7.7 K/uL   Lymphocytes Relative 22 %   Lymphs Abs 1.2 0.7 - 4.0 K/uL   Monocytes Relative 12 %   Monocytes Absolute 0.7 0.1 - 1.0 K/uL   Eosinophils Relative 3 %   Eosinophils Absolute 0.1 0.0 - 0.5 K/uL   Basophils Relative 1 %   Basophils Absolute 0.0 0.0 - 0.1 K/uL   Immature Granulocytes 2 %   Abs Immature Granulocytes 0.09 (H) 0.00 - 0.07 K/uL  Troponin I (High Sensitivity)     Status: Abnormal   Collection Time: 07/13/23  8:27 AM  Result Value  Ref Range   Troponin I (High Sensitivity) 118 (HH) <18 ng/L  TSH     Status: None   Collection Time: 07/13/23  8:27 AM  Result Value Ref Range   TSH 1.403 0.350 - 4.500 uIU/mL  Resp panel by RT-PCR (RSV, Flu A&B, Covid) Anterior Nasal Swab     Status: None  Collection Time: 07/13/23  8:27 AM   Specimen: Anterior Nasal Swab  Result Value Ref Range   SARS Coronavirus 2 by RT PCR NEGATIVE NEGATIVE   Influenza A by PCR NEGATIVE NEGATIVE   Influenza B by PCR NEGATIVE NEGATIVE   Resp Syncytial Virus by PCR NEGATIVE NEGATIVE  Troponin I (High Sensitivity)     Status: Abnormal   Collection Time: 07/13/23 10:45 AM  Result Value Ref Range   Troponin I (High Sensitivity) 185 (HH) <18 ng/L     Assessment and Plan:  Tachycardia:  Improved.  Differential include cardiac mediated tachycardia vs from respiratory illness. Currently asymptomatic.     Elevated troponins and EKG shows non specific t wave abnormalities.  Denies any chest pain or chest tightness and sob.  Echocardiogram ordered.  Differential include myopericarditis vs demand ischemia from  recent respiratory illness vs from hypertensive urgency.   Cardiology consulted for recommendations.    Abnormal CT showing innumerable enlarged mediastinal and hilar lymph nodes with largest in the subcarinal region measuring up to 1.9 x 2.4 cm and largest in the left hilum measuring up to 1.4 x 1.9 cm. It further shows multiple solid noncalcified pulmonary nodules ranging in size from few mm up to 1.8 cm, predominantly in peribroncho vascular location and mainly the upper lobes. Differential include sarcoidosis, lymphoproliferative disease, metastasis.  Recommend outpatient follow up with pulmonology with a repeat CT in 6 weeks.    Hypertensive Urgency BP's elevated on admission .  She was started on coreg and norvasc.     Advance Care Planning: full code.   Consults: cardiology.   Family Communication: none at bedside.   Severity  of Illness: The appropriate patient status for this patient is OBSERVATION. Observation status is judged to be reasonable and necessary in order to provide the required intensity of service to ensure the patient's safety. The patient's presenting symptoms, physical exam findings, and initial radiographic and laboratory data in the context of their medical condition is felt to place them at decreased risk for further clinical deterioration. Furthermore, it is anticipated that the patient will be medically stable for discharge from the hospital within 2 midnights of admission.   Author: Kathlen Mody, MD 07/13/2023 5:30 PM  For on call review [email or web address].

## 2023-07-14 ENCOUNTER — Encounter (HOSPITAL_COMMUNITY): Payer: Self-pay | Admitting: Internal Medicine

## 2023-07-14 ENCOUNTER — Observation Stay (HOSPITAL_COMMUNITY): Payer: BC Managed Care – PPO

## 2023-07-14 ENCOUNTER — Other Ambulatory Visit: Payer: Self-pay

## 2023-07-14 DIAGNOSIS — K429 Umbilical hernia without obstruction or gangrene: Secondary | ICD-10-CM | POA: Diagnosis not present

## 2023-07-14 DIAGNOSIS — D259 Leiomyoma of uterus, unspecified: Secondary | ICD-10-CM | POA: Diagnosis not present

## 2023-07-14 DIAGNOSIS — Z79899 Other long term (current) drug therapy: Secondary | ICD-10-CM | POA: Diagnosis not present

## 2023-07-14 DIAGNOSIS — K760 Fatty (change of) liver, not elsewhere classified: Secondary | ICD-10-CM | POA: Diagnosis not present

## 2023-07-14 DIAGNOSIS — R7989 Other specified abnormal findings of blood chemistry: Secondary | ICD-10-CM | POA: Diagnosis not present

## 2023-07-14 DIAGNOSIS — I160 Hypertensive urgency: Secondary | ICD-10-CM | POA: Diagnosis not present

## 2023-07-14 DIAGNOSIS — R590 Localized enlarged lymph nodes: Secondary | ICD-10-CM | POA: Diagnosis not present

## 2023-07-14 DIAGNOSIS — Z8616 Personal history of COVID-19: Secondary | ICD-10-CM | POA: Diagnosis not present

## 2023-07-14 DIAGNOSIS — R918 Other nonspecific abnormal finding of lung field: Secondary | ICD-10-CM | POA: Diagnosis not present

## 2023-07-14 DIAGNOSIS — R000 Tachycardia, unspecified: Secondary | ICD-10-CM | POA: Diagnosis not present

## 2023-07-14 DIAGNOSIS — Z86718 Personal history of other venous thrombosis and embolism: Secondary | ICD-10-CM | POA: Diagnosis not present

## 2023-07-14 MED ORDER — IOHEXOL 300 MG/ML  SOLN
100.0000 mL | Freq: Once | INTRAMUSCULAR | Status: AC | PRN
  Administered 2023-07-14: 100 mL via INTRAVENOUS

## 2023-07-14 MED ORDER — CARVEDILOL 25 MG PO TABS
25.0000 mg | ORAL_TABLET | Freq: Two times a day (BID) | ORAL | Status: DC
  Administered 2023-07-14 – 2023-07-15 (×2): 25 mg via ORAL
  Filled 2023-07-14 (×2): qty 1

## 2023-07-14 NOTE — Progress Notes (Signed)
 Rounding Note    Patient Name: CATRENA VARI Date of Encounter: 07/14/2023  [facility] Cardiologist: None , Carolan Clines MD,MS  Subjective   Mrs. Courtney Paris is doing well this morning.  Her heart rates are better improved.  She is on room air.  Her CT scan showed possible lymphoproliferative disease versus sarcoidosis versus metastasis.  Inpatient Medications    Scheduled Meds:  amLODipine  10 mg Oral Daily   carvedilol  12.5 mg Oral BID WC   dextromethorphan-guaiFENesin  1 tablet Oral BID   enoxaparin (LOVENOX) injection  40 mg Subcutaneous Q24H   fluticasone  1 spray Each Nare Daily   multivitamin with minerals  1 tablet Oral Daily   Continuous Infusions:  sodium chloride     sodium chloride 75 mL/hr at 07/13/23 1952   PRN Meds: acetaminophen **OR** acetaminophen, ondansetron **OR** ondansetron (ZOFRAN) IV   Vital Signs    Vitals:   07/14/23 0400 07/14/23 0530 07/14/23 0730 07/14/23 0929  BP: 129/81 120/79 (!) 124/91 (!) 124/91  Pulse: 79 79 81 81  Resp: (!) 24 15 16    Temp:  97.9 F (36.6 C)    TempSrc:      SpO2: 94% 97% 100%    No intake or output data in the 24 hours ending 07/14/23 1008    11/27/2022    8:11 AM 07/14/2018   10:31 AM 06/08/2018   10:02 AM  Last 3 Weights  Weight (lbs) 220 lb 223 lb 3.2 oz 225 lb  Weight (kg) 99.791 kg 101.243 kg 102.059 kg      Telemetry    Sinus rhythm- Personally Reviewed  ECG    No new- Personally Reviewed  Physical Exam   Vitals:   07/14/23 0730 07/14/23 0929  BP: (!) 124/91 (!) 124/91  Pulse: 81 81  Resp: 16   Temp:    SpO2: 100%     GEN: No acute distress.   Neck: No JVD Cardiac: RRR, no murmurs, rubs, or gallops.  Respiratory: Clear to auscultation bilaterally. GI: Soft, nontender, non-distended  MS: No edema; No deformity. Neuro:  Nonfocal  Psych: Normal affect   Labs    High Sensitivity Troponin:   Recent Labs  Lab 07/13/23 0827 07/13/23 1045  TROPONINIHS 118*  185*     Chemistry Recent Labs  Lab 07/13/23 0827  NA 138  K 3.7  CL 105  CO2 24  GLUCOSE 160*  BUN 9  CREATININE 0.73  CALCIUM 9.6  GFRNONAA >60  ANIONGAP 9    Lipids No results for input(s): "CHOL", "TRIG", "HDL", "LABVLDL", "LDLCALC", "CHOLHDL" in the last 168 hours.  Hematology Recent Labs  Lab 07/13/23 0827  WBC 5.5  RBC 5.13*  HGB 14.4  HCT 45.1  MCV 87.9  MCH 28.1  MCHC 31.9  RDW 13.3  PLT 285   Thyroid  Recent Labs  Lab 07/13/23 0827  TSH 1.403    BNPNo results for input(s): "BNP", "PROBNP" in the last 168 hours.  DDimer No results for input(s): "DDIMER" in the last 168 hours.   Radiology    ECHOCARDIOGRAM COMPLETE Result Date: 07/13/2023    ECHOCARDIOGRAM REPORT   Patient Name:   SHALICE WOODRING Date of Exam: 07/13/2023 Medical Rec #:  [id number]            Height:       66.0 in Accession #:    [id number]           Weight:  220.0 lb Date of Birth:  07/12/1970            BSA:          2.082 m Patient Age:    53 years             BP:           161/96 mmHg Patient Gender: F                    HR:           93 bpm. Exam Location:  Inpatient Procedure: 2D Echo, Cardiac Doppler and Color Doppler (Both Spectral and Color            Flow Doppler were utilized during procedure). Indications:    Cardiomyopathy-unspecified  History:        Patient has no prior history of Echocardiogram examinations.                 Arrythmias:Tachycardia; Signs/Symptoms:Fatigue.  Sonographer:    Vern Claude Referring Phys: [phone] ANTHONY ALLEN IMPRESSIONS  1. Left ventricular ejection fraction, by estimation, is 70 to 75%. The left ventricle has hyperdynamic function. The left ventricle has no regional wall motion abnormalities. Left ventricular diastolic parameters are consistent with Grade I diastolic dysfunction (impaired relaxation).  2. Right ventricular systolic function is normal. The right ventricular size is normal.  3. The mitral valve is normal in structure. Trivial mitral  valve regurgitation. No evidence of mitral stenosis.  4. The aortic valve is normal in structure. Aortic valve regurgitation is trivial. No aortic stenosis is present.  5. The inferior vena cava is normal in size with greater than 50% respiratory variability, suggesting right atrial pressure of 3 mmHg. FINDINGS  Left Ventricle: Left ventricular ejection fraction, by estimation, is 70 to 75%. The left ventricle has hyperdynamic function. The left ventricle has no regional wall motion abnormalities. Strain imaging was not performed. The left ventricular internal cavity size was normal in size. There is no left ventricular hypertrophy. Left ventricular diastolic parameters are consistent with Grade I diastolic dysfunction (impaired relaxation). Right Ventricle: The right ventricular size is normal. No increase in right ventricular wall thickness. Right ventricular systolic function is normal. Left Atrium: Left atrial size was normal in size. Right Atrium: Right atrial size was normal in size. Pericardium: There is no evidence of pericardial effusion. Mitral Valve: The mitral valve is normal in structure. Trivial mitral valve regurgitation. No evidence of mitral valve stenosis. MV peak gradient, 4.2 mmHg. The mean mitral valve gradient is 2.0 mmHg. Tricuspid Valve: The tricuspid valve is normal in structure. Tricuspid valve regurgitation is trivial. No evidence of tricuspid stenosis. Aortic Valve: The aortic valve is normal in structure. Aortic valve regurgitation is trivial. No aortic stenosis is present. Aortic valve mean gradient measures 3.0 mmHg. Aortic valve peak gradient measures 5.1 mmHg. Aortic valve area, by VTI measures 3.18 cm. Pulmonic Valve: The pulmonic valve was normal in structure. Pulmonic valve regurgitation is trivial. No evidence of pulmonic stenosis. Aorta: The aortic root is normal in size and structure. Venous: The inferior vena cava is normal in size with greater than 50% respiratory  variability, suggesting right atrial pressure of 3 mmHg. IAS/Shunts: No atrial level shunt detected by color flow Doppler. Additional Comments: 3D imaging was not performed.  LEFT VENTRICLE PLAX 2D LVIDd:         3.80 cm      Diastology LVIDs:  2.80 cm      LV e' medial:    18.30 cm/s LV PW:         0.80 cm      LV E/e' medial:  3.3 LV IVS:        1.10 cm      LV e' lateral:   9.79 cm/s LVOT diam:     2.00 cm      LV E/e' lateral: 6.1 LV SV:         53 LV SV Index:   25 LVOT Area:     3.14 cm  LV Volumes (MOD) LV vol d, MOD A2C: 106.0 ml LV vol d, MOD A4C: 91.5 ml LV vol s, MOD A2C: 32.3 ml LV vol s, MOD A4C: 31.0 ml LV SV MOD A2C:     73.7 ml LV SV MOD A4C:     91.5 ml LV SV MOD BP:      73.0 ml RIGHT VENTRICLE             IVC RV Basal diam:  3.10 cm     IVC diam: 1.20 cm RV Mid diam:    2.40 cm RV S prime:     21.40 cm/s LEFT ATRIUM             Index        RIGHT ATRIUM           Index LA diam:        2.90 cm 1.39 cm/m   RA Area:     11.30 cm LA Vol (A2C):   20.0 ml 9.60 ml/m   RA Volume:   26.10 ml  12.53 ml/m LA Vol (A4C):   23.1 ml 11.09 ml/m LA Biplane Vol: 22.2 ml 10.66 ml/m  AORTIC VALVE                    PULMONIC VALVE AV Area (Vmax):    2.59 cm     PV Vmax:       1.01 m/s AV Area (Vmean):   2.79 cm     PV Peak grad:  4.0 mmHg AV Area (VTI):     3.18 cm AV Vmax:           113.00 cm/s AV Vmean:          76.400 cm/s AV VTI:            0.167 m AV Peak Grad:      5.1 mmHg AV Mean Grad:      3.0 mmHg LVOT Vmax:         93.00 cm/s LVOT Vmean:        67.800 cm/s LVOT VTI:          0.169 m LVOT/AV VTI ratio: 1.01  AORTA Ao Root diam: 3.00 cm Ao Asc diam:  3.20 cm MITRAL VALVE MV Area (PHT): 4.60 cm     SHUNTS MV Area VTI:   3.47 cm     Systemic VTI:  0.17 m MV Peak grad:  4.2 mmHg     Systemic Diam: 2.00 cm MV Mean grad:  2.0 mmHg MV Vmax:       1.02 m/s MV Vmean:      71.7 cm/s MV Decel Time: 165 msec MV E velocity: 60.00 cm/s MV A velocity: 102.00 cm/s MV E/A ratio:  0.59 Arvilla Meres MD  Electronically signed by Arvilla Meres MD Signature Date/Time: 07/13/2023/4:27:42 PM    Final  CT Angio Chest PE W/Cm &/Or Wo Cm Result Date: 07/13/2023 CLINICAL DATA:  Pulmonary embolism (PE) suspected, high prob. Palpitations. Heaviness. EXAM: CT ANGIOGRAPHY CHEST WITH CONTRAST TECHNIQUE: Multidetector CT imaging of the chest was performed using the standard protocol during bolus administration of intravenous contrast. Multiplanar CT image reconstructions and MIPs were obtained to evaluate the vascular anatomy. RADIATION DOSE REDUCTION: This exam was performed according to the departmental dose-optimization program which includes automated exposure control, adjustment of the mA and/or kV according to patient size and/or use of iterative reconstruction technique. CONTRAST:  80mL OMNIPAQUE IOHEXOL 350 MG/ML SOLN COMPARISON:  None Available. FINDINGS: Cardiovascular: No evidence of embolism to the proximal subsegmental pulmonary artery level. Normal cardiac size. No pericardial effusion. No aortic aneurysm. Mediastinum/Nodes: Enlarged and heterogeneous thyroid gland however without discrete nodule, not well evaluated on this exam. No solid / cystic mediastinal masses. The esophagus is nondistended precluding optimal assessment. There are innumerable enlarged mediastinal and hilar lymph nodes with largest in the subcarinal region measuring up to 1.9 x 2.4 cm and largest in the left hilum measuring up to 1.4 x 1.9 cm. No axillary lymphadenopathy by size criteria. Lungs/Pleura: The central tracheo-bronchial tree is patent. There are multiple solid noncalcified pulmonary nodules ranging in size from few mm up to 1.8 cm, predominantly in peribronchovascular location and mainly the upper lobes. No pleural effusion or pneumothorax. Upper Abdomen: Visualized upper abdominal viscera within normal limits. Musculoskeletal: The visualized soft tissues of the chest wall are grossly unremarkable. No suspicious osseous  lesions. There are mild multilevel degenerative changes in the visualized spine. Review of the MIP images confirms the above findings. IMPRESSION: 1. No embolism to the proximal subsegmental pulmonary artery level. 2. Multiple solid noncalcified pulmonary nodules, predominantly in peribronchovascular location and mainly in the upper lobes. There is associated mediastinal and hilar lymphadenopathy. Differential diagnosis includes sarcoidosis, lymphoproliferative disease, metastases, etc. 3. Multiple other nonacute observations, as described above. Electronically Signed   By: Jules Schick M.D.   On: 07/13/2023 13:58    Cardiac Studies   TTE 07/13/2023 Normal LV function Normal RV function No valve disease No pulmonary hypertension Normal IVC  Patient Profile     53 y.o. female with hypertension who presented here with sinus tachycardia, she was found to have elevated troponin levels and cardiology was consulted.  Her CT shows multiple pulmonary nodules  Assessment & Plan    Sinus tachycardia Possibly in the setting of a flare?  The work up for her multiple pulmonary nodules is still ongoing.  Would consider pulm consult.  Continue her beta-blocker  Troponin elevation Suspect this is demand ischemia.  She has no significant CVD risk factors and no signs of ischemia on her ECG.  Her echocardiogram was normal.  HTN urgency She had significantly elevated blood pressures on admission, up to the 200s.  -Blood pressures are much better controlled; her diastolic blood pressure still a bit elevated -Will increase Coreg from 12.5 to 25 mg twice daily -Will continue Norvasc 10 mg daily  Otherwise from a cardiology perspective there are no further plans for workup for her.  We will follow-up with her as an outpatient.  Cardiology will sign off.  For questions or updates, please contact [facility] Please consult [email or web address] for contact info under        Signed, Maisie Fus, MD   07/14/2023, 10:08 AM

## 2023-07-14 NOTE — ED Notes (Signed)
 Rn provided breakfast tray to pt

## 2023-07-14 NOTE — Progress Notes (Signed)
 PROGRESS NOTE  Brenda Valencia  ZOX:[id number] DOB: Jan 19, 1971 DOA: 07/13/2023 PCP: Pcp, No   Brief Narrative: Patient is a 53 year old female with history of seasonal allergies who presented to the Emergency Department from home with complaint of fast heartbeat.  Reported upper respiratory illness on the first week of February, now has lingering  cough.  No fever or chills or shortness of breath.  On presentation, she was afebrile, tachycardic, hypertensive.  Labs elevated troponin with flat trend.  TSH  normal.  Respiratory viral panel negative.  CT angio was negative for PE but showed hilar, distal lymphadenopathy, lung nodules.  EKG showed nonspecific T wave changes.  PCCM, cardiology consulted  Assessment & Plan:  Principal Problem:   Tachycardia  Sinus tachycardia/elevated troponin: Now improved.  TSH normal.  Currently asymptomatic.  Denies chest pain.  EKG showed nonspecific T wave changes.  Echo showed EF of send recent 20%, grade 1 diastolic dysfunction, normal right ventricular function.  Elevated likely from demand ischemia from hypertension.  CT negative for PE.  Currently on normal sinus rhythm, not tachycardic  Lung nodules/lymphadenopathy: CT showed multiple solid noncalcified pulmonary nodules, predominantly in peribronchovascular location and mainly in the upper lobes.  Associated mediastinal and hilar lymphadenopathy. Differentia includes sarcoidosis, lymphoproliferative disease, metastases, etc. consulted pulmonology to check if she is a candidate for bronchoscopy.  Also ordered CT abdomen/pelvis to rule out any malignancy  Hypertension: started on  Coreg and Norvasc.  Blood pressure better this morning         DVT prophylaxis:enoxaparin (LOVENOX) injection 40 mg Start: 07/13/23 2200     Code Status: Full Code  Family Communication: None at the bedside  Patient status: Inpatient  Patient is from : Home  Anticipated discharge to: Home  Estimated DC date:  Tomorrow   Consultants: PCCM, cardiology  Procedures: None  Antimicrobials:  Anti-infectives (From admission, onward)    None       Subjective: Patient seen and examined at bedside today.  Hemodynamically stable.  Very comfortable this morning.  Blood pressure stable.  Heart rate less than 100.  No chest pain or shortness of breath  Objective: Vitals:   07/13/23 2300 07/14/23 0100 07/14/23 0400 07/14/23 0530  BP: 134/81 125/81 129/81 120/79  Pulse: 79 83 79 79  Resp: (!) 21 19 (!) 24 15  Temp:  98.4 F (36.9 C)  97.9 F (36.6 C)  TempSrc:      SpO2: 97% 95% 94% 97%   No intake or output data in the 24 hours ending 07/14/23 0826 There were no vitals filed for this visit.  Examination:  General exam: Overall comfortable, not in distress, obese HEENT: PERRL Respiratory system:  no wheezes or crackles  Cardiovascular system: S1 & S2 heard, RRR.  Gastrointestinal system: Abdomen is nondistended, soft and nontender. Central nervous system: Alert and oriented Extremities: No edema, no clubbing ,no cyanosis Skin: No rashes, no ulcers,no icterus     Data Reviewed: I have personally reviewed following labs and imaging studies  CBC: Recent Labs  Lab 07/13/23 0827  WBC 5.5  NEUTROABS 3.3  HGB 14.4  HCT 45.1  MCV 87.9  PLT 285   Basic Metabolic Panel: Recent Labs  Lab 07/13/23 0827  NA 138  K 3.7  CL 105  CO2 24  GLUCOSE 160*  BUN 9  CREATININE 0.73  CALCIUM 9.6     Recent Results (from the past 240 hours)  Resp panel by RT-PCR (RSV, Flu A&B, Covid) Anterior Nasal Swab  Status: None   Collection Time: 07/13/23  8:27 AM   Specimen: Anterior Nasal Swab  Result Value Ref Range Status   SARS Coronavirus 2 by RT PCR NEGATIVE NEGATIVE Final    Comment: (NOTE) SARS-CoV-2 target nucleic acids are NOT DETECTED.  The SARS-CoV-2 RNA is generally detectable in upper respiratory specimens during the acute phase of infection. The lowest concentration of  SARS-CoV-2 viral copies this assay can detect is 138 copies/mL. A negative result does not preclude SARS-Cov-2 infection and should not be used as the sole basis for treatment or other patient management decisions. A negative result may occur with  improper specimen collection/handling, submission of specimen other than nasopharyngeal swab, presence of viral mutation(s) within the areas targeted by this assay, and inadequate number of viral copies(<138 copies/mL). A negative result must be combined with clinical observations, patient history, and epidemiological information. The expected result is Negative.  Fact Sheet for Patients:  BloggerCourse.com  Fact Sheet for Healthcare Providers:  SeriousBroker.it  This test is no t yet approved or cleared by the [place] FDA and  has been authorized for detection and/or diagnosis of SARS-CoV-2 by FDA under an Emergency Use Authorization (EUA). This EUA will remain  in effect (meaning this test can be used) for the duration of the COVID-19 declaration under Section 564(b)(1) of the Act, 21 U.S.C.section 360bbb-3(b)(1), unless the authorization is terminated  or revoked sooner.       Influenza A by PCR NEGATIVE NEGATIVE Final   Influenza B by PCR NEGATIVE NEGATIVE Final    Comment: (NOTE) The Xpert Xpress SARS-CoV-2/FLU/RSV plus assay is intended as an aid in the diagnosis of influenza from Nasopharyngeal swab specimens and should not be used as a sole basis for treatment. Nasal washings and aspirates are unacceptable for Xpert Xpress SARS-CoV-2/FLU/RSV testing.  Fact Sheet for Patients: BloggerCourse.com  Fact Sheet for Healthcare Providers: SeriousBroker.it  This test is not yet approved or cleared by the [place] FDA and has been authorized for detection and/or diagnosis of SARS-CoV-2 by FDA under an Emergency Use  Authorization (EUA). This EUA will remain in effect (meaning this test can be used) for the duration of the COVID-19 declaration under Section 564(b)(1) of the Act, 21 U.S.C. section 360bbb-3(b)(1), unless the authorization is terminated or revoked.     Resp Syncytial Virus by PCR NEGATIVE NEGATIVE Final    Comment: (NOTE) Fact Sheet for Patients: BloggerCourse.com  Fact Sheet for Healthcare Providers: SeriousBroker.it  This test is not yet approved or cleared by the [place] FDA and has been authorized for detection and/or diagnosis of SARS-CoV-2 by FDA under an Emergency Use Authorization (EUA). This EUA will remain in effect (meaning this test can be used) for the duration of the COVID-19 declaration under Section 564(b)(1) of the Act, 21 U.S.C. section 360bbb-3(b)(1), unless the authorization is terminated or revoked.  Performed at [facility], [address], [place], [place] [address]      Radiology Studies: ECHOCARDIOGRAM COMPLETE Result Date: 07/13/2023    ECHOCARDIOGRAM REPORT   Patient Name:   Brenda Valencia Date of Exam: 07/13/2023 Medical Rec #:  [id number]            Height:       66.0 in Accession #:    [id number]           Weight:       220.0 lb Date of Birth:  01-29-1971  BSA:          2.082 m Patient Age:    52 years             BP:           161/96 mmHg Patient Gender: F                    HR:           93 bpm. Exam Location:  Inpatient Procedure: 2D Echo, Cardiac Doppler and Color Doppler (Both Spectral and Color            Flow Doppler were utilized during procedure). Indications:    Cardiomyopathy-unspecified  History:        Patient has no prior history of Echocardiogram examinations.                 Arrythmias:Tachycardia; Signs/Symptoms:Fatigue.  Sonographer:    Vern Claude Referring Phys: [phone] ANTHONY ALLEN IMPRESSIONS  1. Left ventricular ejection fraction, by estimation,  is 70 to 75%. The left ventricle has hyperdynamic function. The left ventricle has no regional wall motion abnormalities. Left ventricular diastolic parameters are consistent with Grade I diastolic dysfunction (impaired relaxation).  2. Right ventricular systolic function is normal. The right ventricular size is normal.  3. The mitral valve is normal in structure. Trivial mitral valve regurgitation. No evidence of mitral stenosis.  4. The aortic valve is normal in structure. Aortic valve regurgitation is trivial. No aortic stenosis is present.  5. The inferior vena cava is normal in size with greater than 50% respiratory variability, suggesting right atrial pressure of 3 mmHg. FINDINGS  Left Ventricle: Left ventricular ejection fraction, by estimation, is 70 to 75%. The left ventricle has hyperdynamic function. The left ventricle has no regional wall motion abnormalities. Strain imaging was not performed. The left ventricular internal cavity size was normal in size. There is no left ventricular hypertrophy. Left ventricular diastolic parameters are consistent with Grade I diastolic dysfunction (impaired relaxation). Right Ventricle: The right ventricular size is normal. No increase in right ventricular wall thickness. Right ventricular systolic function is normal. Left Atrium: Left atrial size was normal in size. Right Atrium: Right atrial size was normal in size. Pericardium: There is no evidence of pericardial effusion. Mitral Valve: The mitral valve is normal in structure. Trivial mitral valve regurgitation. No evidence of mitral valve stenosis. MV peak gradient, 4.2 mmHg. The mean mitral valve gradient is 2.0 mmHg. Tricuspid Valve: The tricuspid valve is normal in structure. Tricuspid valve regurgitation is trivial. No evidence of tricuspid stenosis. Aortic Valve: The aortic valve is normal in structure. Aortic valve regurgitation is trivial. No aortic stenosis is present. Aortic valve mean gradient measures 3.0  mmHg. Aortic valve peak gradient measures 5.1 mmHg. Aortic valve area, by VTI measures 3.18 cm. Pulmonic Valve: The pulmonic valve was normal in structure. Pulmonic valve regurgitation is trivial. No evidence of pulmonic stenosis. Aorta: The aortic root is normal in size and structure. Venous: The inferior vena cava is normal in size with greater than 50% respiratory variability, suggesting right atrial pressure of 3 mmHg. IAS/Shunts: No atrial level shunt detected by color flow Doppler. Additional Comments: 3D imaging was not performed.  LEFT VENTRICLE PLAX 2D LVIDd:         3.80 cm      Diastology LVIDs:         2.80 cm      LV e' medial:    18.30 cm/s LV PW:  0.80 cm      LV E/e' medial:  3.3 LV IVS:        1.10 cm      LV e' lateral:   9.79 cm/s LVOT diam:     2.00 cm      LV E/e' lateral: 6.1 LV SV:         53 LV SV Index:   25 LVOT Area:     3.14 cm  LV Volumes (MOD) LV vol d, MOD A2C: 106.0 ml LV vol d, MOD A4C: 91.5 ml LV vol s, MOD A2C: 32.3 ml LV vol s, MOD A4C: 31.0 ml LV SV MOD A2C:     73.7 ml LV SV MOD A4C:     91.5 ml LV SV MOD BP:      73.0 ml RIGHT VENTRICLE             IVC RV Basal diam:  3.10 cm     IVC diam: 1.20 cm RV Mid diam:    2.40 cm RV S prime:     21.40 cm/s LEFT ATRIUM             Index        RIGHT ATRIUM           Index LA diam:        2.90 cm 1.39 cm/m   RA Area:     11.30 cm LA Vol (A2C):   20.0 ml 9.60 ml/m   RA Volume:   26.10 ml  12.53 ml/m LA Vol (A4C):   23.1 ml 11.09 ml/m LA Biplane Vol: 22.2 ml 10.66 ml/m  AORTIC VALVE                    PULMONIC VALVE AV Area (Vmax):    2.59 cm     PV Vmax:       1.01 m/s AV Area (Vmean):   2.79 cm     PV Peak grad:  4.0 mmHg AV Area (VTI):     3.18 cm AV Vmax:           113.00 cm/s AV Vmean:          76.400 cm/s AV VTI:            0.167 m AV Peak Grad:      5.1 mmHg AV Mean Grad:      3.0 mmHg LVOT Vmax:         93.00 cm/s LVOT Vmean:        67.800 cm/s LVOT VTI:          0.169 m LVOT/AV VTI ratio: 1.01  AORTA Ao Root diam:  3.00 cm Ao Asc diam:  3.20 cm MITRAL VALVE MV Area (PHT): 4.60 cm     SHUNTS MV Area VTI:   3.47 cm     Systemic VTI:  0.17 m MV Peak grad:  4.2 mmHg     Systemic Diam: 2.00 cm MV Mean grad:  2.0 mmHg MV Vmax:       1.02 m/s MV Vmean:      71.7 cm/s MV Decel Time: 165 msec MV E velocity: 60.00 cm/s MV A velocity: 102.00 cm/s MV E/A ratio:  0.59 Arvilla Meres MD Electronically signed by Arvilla Meres MD Signature Date/Time: 07/13/2023/4:27:42 PM    Final    CT Angio Chest PE W/Cm &/Or Wo Cm Result Date: 07/13/2023 CLINICAL DATA:  Pulmonary embolism (PE) suspected, high prob. Palpitations. Heaviness. EXAM: CT ANGIOGRAPHY  CHEST WITH CONTRAST TECHNIQUE: Multidetector CT imaging of the chest was performed using the standard protocol during bolus administration of intravenous contrast. Multiplanar CT image reconstructions and MIPs were obtained to evaluate the vascular anatomy. RADIATION DOSE REDUCTION: This exam was performed according to the departmental dose-optimization program which includes automated exposure control, adjustment of the mA and/or kV according to patient size and/or use of iterative reconstruction technique. CONTRAST:  80mL OMNIPAQUE IOHEXOL 350 MG/ML SOLN COMPARISON:  None Available. FINDINGS: Cardiovascular: No evidence of embolism to the proximal subsegmental pulmonary artery level. Normal cardiac size. No pericardial effusion. No aortic aneurysm. Mediastinum/Nodes: Enlarged and heterogeneous thyroid gland however without discrete nodule, not well evaluated on this exam. No solid / cystic mediastinal masses. The esophagus is nondistended precluding optimal assessment. There are innumerable enlarged mediastinal and hilar lymph nodes with largest in the subcarinal region measuring up to 1.9 x 2.4 cm and largest in the left hilum measuring up to 1.4 x 1.9 cm. No axillary lymphadenopathy by size criteria. Lungs/Pleura: The central tracheo-bronchial tree is patent. There are multiple solid  noncalcified pulmonary nodules ranging in size from few mm up to 1.8 cm, predominantly in peribronchovascular location and mainly the upper lobes. No pleural effusion or pneumothorax. Upper Abdomen: Visualized upper abdominal viscera within normal limits. Musculoskeletal: The visualized soft tissues of the chest wall are grossly unremarkable. No suspicious osseous lesions. There are mild multilevel degenerative changes in the visualized spine. Review of the MIP images confirms the above findings. IMPRESSION: 1. No embolism to the proximal subsegmental pulmonary artery level. 2. Multiple solid noncalcified pulmonary nodules, predominantly in peribronchovascular location and mainly in the upper lobes. There is associated mediastinal and hilar lymphadenopathy. Differential diagnosis includes sarcoidosis, lymphoproliferative disease, metastases, etc. 3. Multiple other nonacute observations, as described above. Electronically Signed   By: Jules Schick M.D.   On: 07/13/2023 13:58    Scheduled Meds:  amLODipine  10 mg Oral Daily   carvedilol  12.5 mg Oral BID WC   dextromethorphan-guaiFENesin  1 tablet Oral BID   enoxaparin (LOVENOX) injection  40 mg Subcutaneous Q24H   fluticasone  1 spray Each Nare Daily   multivitamin with minerals  1 tablet Oral Daily   Continuous Infusions:  sodium chloride     sodium chloride 75 mL/hr at 07/13/23 1952     LOS: 0 days   Burnadette Pop, MD [facility] P2/26/2025, 8:26 AM

## 2023-07-15 ENCOUNTER — Observation Stay (HOSPITAL_COMMUNITY): Payer: BC Managed Care – PPO | Admitting: Certified Registered"

## 2023-07-15 ENCOUNTER — Telehealth: Payer: Self-pay | Admitting: Pulmonary Disease

## 2023-07-15 ENCOUNTER — Encounter (HOSPITAL_COMMUNITY)
Admission: EM | Disposition: A | Discharge status: Home / Self Care | Payer: Self-pay | Source: Home / Self Care | Attending: Emergency Medicine

## 2023-07-15 ENCOUNTER — Other Ambulatory Visit (HOSPITAL_COMMUNITY): Payer: Self-pay

## 2023-07-15 ENCOUNTER — Encounter (HOSPITAL_COMMUNITY): Payer: Self-pay | Admitting: Internal Medicine

## 2023-07-15 DIAGNOSIS — R591 Generalized enlarged lymph nodes: Secondary | ICD-10-CM | POA: Diagnosis not present

## 2023-07-15 DIAGNOSIS — R590 Localized enlarged lymph nodes: Secondary | ICD-10-CM

## 2023-07-15 DIAGNOSIS — R918 Other nonspecific abnormal finding of lung field: Secondary | ICD-10-CM | POA: Diagnosis not present

## 2023-07-15 DIAGNOSIS — Z79899 Other long term (current) drug therapy: Secondary | ICD-10-CM | POA: Diagnosis not present

## 2023-07-15 DIAGNOSIS — Z8616 Personal history of COVID-19: Secondary | ICD-10-CM | POA: Diagnosis not present

## 2023-07-15 DIAGNOSIS — R000 Tachycardia, unspecified: Secondary | ICD-10-CM | POA: Diagnosis not present

## 2023-07-15 DIAGNOSIS — I160 Hypertensive urgency: Secondary | ICD-10-CM | POA: Diagnosis not present

## 2023-07-15 DIAGNOSIS — Z86718 Personal history of other venous thrombosis and embolism: Secondary | ICD-10-CM | POA: Diagnosis not present

## 2023-07-15 DIAGNOSIS — R7989 Other specified abnormal findings of blood chemistry: Secondary | ICD-10-CM | POA: Diagnosis not present

## 2023-07-15 HISTORY — PX: BRONCHIAL WASHINGS: SHX5105

## 2023-07-15 HISTORY — PX: BRONCHIAL NEEDLE ASPIRATION BIOPSY: SHX5106

## 2023-07-15 HISTORY — PX: ENDOBRONCHIAL ULTRASOUND: SHX5096

## 2023-07-15 HISTORY — PX: VIDEO BRONCHOSCOPY: SHX5072

## 2023-07-15 LAB — BODY FLUID CELL COUNT WITH DIFFERENTIAL
Eos, Fluid: 9 %
Lymphs, Fluid: 2 %
Monocyte-Macrophage-Serous Fluid: 3 % — ABNORMAL LOW (ref 50–90)
Neutrophil Count, Fluid: 86 % — ABNORMAL HIGH (ref 0–25)
Total Nucleated Cell Count, Fluid: 31 uL (ref 0–1000)

## 2023-07-15 SURGERY — BRONCHOSCOPY, VIDEO-ASSISTED
Anesthesia: General | Laterality: Bilateral

## 2023-07-15 MED ORDER — CHLORHEXIDINE GLUCONATE 0.12 % MT SOLN
OROMUCOSAL | Status: AC
  Filled 2023-07-15: qty 15

## 2023-07-15 MED ORDER — PROPOFOL 10 MG/ML IV BOLUS
INTRAVENOUS | Status: DC | PRN
  Administered 2023-07-15: 150 mg via INTRAVENOUS

## 2023-07-15 MED ORDER — LIDOCAINE 2% (20 MG/ML) 5 ML SYRINGE
INTRAMUSCULAR | Status: DC | PRN
  Administered 2023-07-15: 80 mg via INTRAVENOUS

## 2023-07-15 MED ORDER — AMLODIPINE BESYLATE 10 MG PO TABS
10.0000 mg | ORAL_TABLET | Freq: Every day | ORAL | 0 refills | Status: DC
  Filled 2023-07-15: qty 60, 60d supply, fill #0

## 2023-07-15 MED ORDER — OMRON 3 SERIES BP MONITOR DEVI
1.0000 | Freq: Two times a day (BID) | 0 refills | Status: AC
  Filled 2023-07-15: qty 1, 30d supply, fill #0

## 2023-07-15 MED ORDER — SUCCINYLCHOLINE CHLORIDE 200 MG/10ML IV SOSY
PREFILLED_SYRINGE | INTRAVENOUS | Status: DC | PRN
Start: 2023-07-15 — End: 2023-07-15
  Administered 2023-07-15: 100 mg via INTRAVENOUS

## 2023-07-15 MED ORDER — CARVEDILOL 25 MG PO TABS
25.0000 mg | ORAL_TABLET | Freq: Two times a day (BID) | ORAL | 0 refills | Status: DC
  Filled 2023-07-15: qty 120, 60d supply, fill #0

## 2023-07-15 MED ORDER — FENTANYL CITRATE (PF) 100 MCG/2ML IJ SOLN
INTRAMUSCULAR | Status: DC | PRN
Start: 2023-07-15 — End: 2023-07-15
  Administered 2023-07-15: 100 ug via INTRAVENOUS

## 2023-07-15 MED ORDER — GLYCOPYRROLATE PF 0.2 MG/ML IJ SOSY
PREFILLED_SYRINGE | INTRAMUSCULAR | Status: DC | PRN
  Administered 2023-07-15: .2 mg via INTRAVENOUS

## 2023-07-15 MED ORDER — DM-GUAIFENESIN ER 30-600 MG PO TB12
1.0000 | ORAL_TABLET | Freq: Two times a day (BID) | ORAL | 0 refills | Status: AC | PRN
  Filled 2023-07-15: qty 20, 10d supply, fill #0

## 2023-07-15 MED ORDER — ROCURONIUM BROMIDE 10 MG/ML (PF) SYRINGE
PREFILLED_SYRINGE | INTRAVENOUS | Status: DC | PRN
  Administered 2023-07-15: 10 mg via INTRAVENOUS

## 2023-07-15 MED ORDER — FENTANYL CITRATE (PF) 100 MCG/2ML IJ SOLN
INTRAMUSCULAR | Status: AC
  Filled 2023-07-15: qty 2

## 2023-07-15 MED ORDER — CHLORHEXIDINE GLUCONATE 0.12 % MT SOLN
15.0000 mL | Freq: Once | OROMUCOSAL | Status: AC
  Administered 2023-07-15: 15 mL via OROMUCOSAL

## 2023-07-15 MED ORDER — PHENYLEPHRINE HCL-NACL 20-0.9 MG/250ML-% IV SOLN
INTRAVENOUS | Status: DC | PRN
  Administered 2023-07-15: 160 ug via INTRAVENOUS
  Administered 2023-07-15 (×2): 80 ug via INTRAVENOUS

## 2023-07-15 MED ORDER — BUTALBITAL-APAP-CAFFEINE 50-325-40 MG PO TABS
1.0000 | ORAL_TABLET | ORAL | Status: DC | PRN
  Administered 2023-07-15: 1 via ORAL
  Filled 2023-07-15: qty 1

## 2023-07-15 MED ORDER — ALBUTEROL SULFATE HFA 108 (90 BASE) MCG/ACT IN AERS
INHALATION_SPRAY | RESPIRATORY_TRACT | Status: DC | PRN
  Administered 2023-07-15: 6 via RESPIRATORY_TRACT

## 2023-07-15 MED ORDER — DEXAMETHASONE SODIUM PHOSPHATE 10 MG/ML IJ SOLN
INTRAMUSCULAR | Status: DC | PRN
  Administered 2023-07-15: 8 mg via INTRAVENOUS

## 2023-07-15 MED ORDER — ONDANSETRON HCL 4 MG/2ML IJ SOLN
INTRAMUSCULAR | Status: DC | PRN
  Administered 2023-07-15: 4 mg via INTRAVENOUS

## 2023-07-15 NOTE — Progress Notes (Signed)
 Discharge medications hand delivered from [facility] outpatient pharmacy by this RN  Discharge instructions reviewed with patient. All questions answered. All belongings accounted for. Patient to follow up with MD/PCP in  1 weeks.   Plan for discharge around 4pm per primary RN given tolerating diet

## 2023-07-15 NOTE — Interval H&P Note (Signed)
 History and Physical Interval Note:  07/15/2023 10:43 AM  Brenda Valencia  has presented today for surgery, with the diagnosis of lymphdenopathy.  The various methods of treatment have been discussed with the patient and family. After consideration of risks, benefits and other options for treatment, the patient has consented to  Procedure(s): ENDOBRONCHIAL ULTRASOUND (Bilateral) as a surgical intervention.  The patient's history has been reviewed, patient examined, no change in status, stable for surgery.  I have reviewed the patient's chart and labs.  Questions were answered to the patient's satisfaction.     Comer Locket Vassie Loll

## 2023-07-15 NOTE — Discharge Summary (Addendum)
 Physician Discharge Summary  Brenda Valencia VOZ:[id number] DOB: April 18, 1971 DOA: 07/13/2023  PCP: Pcp, No  Admit date: 07/13/2023 Discharge date: 07/15/2023  Admitted From: Home Disposition:  Home  Discharge Condition:Stable CODE STATUS:FULL Diet recommendation: Heart Healthy  Brief/Interim Summary: Patient is a 53 year old female with history of seasonal allergies who presented to the Emergency Department from home with complaint of fast heartbeat.  Reported upper respiratory illness on the first week of February, now has lingering  cough.  No fever or chills or shortness of breath.  On presentation, she was afebrile, tachycardic, hypertensive.  Labs elevated troponin with flat trend.  TSH  normal.  Respiratory viral panel negative.  CT angio was negative for PE but showed hilar, distal lymphadenopathy, lung nodules.  EKG showed nonspecific T wave changes.  PCCM, cardiology consulted .  Underwent bronchoscopy today.  Pathology will followed by pulmonology .  Medically stable for discharge home today.  Following problems were addressed during the hospitalization:  Sinus tachycardia/elevated troponin: Now improved.  TSH normal.  Currently asymptomatic.  Denies chest pain.  EKG showed nonspecific T wave changes.  Echo showed EF of send recent 20%, grade 1 diastolic dysfunction, normal right ventricular function.  Elevated likely from demand ischemia from hypertension.  CT negative for PE.  Currently on normal sinus rhythm, not tachycardic   Lung nodules/lymphadenopathy: CT showed multiple solid noncalcified pulmonary nodules, predominantly in peribronchovascular location and mainly in the upper lobes.  Associated mediastinal and hilar lymphadenopathy. Differentia includes sarcoidosis, lymphoproliferative disease, metastases, etc. consulted pulmonology.  Underwent bronch.  Pathology will be followed by pulmonology and might be arranged outpatient follow-up.  Abdomen/pelvis CT did not show  abdominopelvic lymphadenopathy by size criteria. No inguinal lymphadenopathy.  Showed 9 mm x 9 mm hypoattenuated lesion in the liver, can do nonemergent MRI of the liver as an outpatient   Hypertension: started on  Coreg and Norvasc.  Blood pressure stable this morning  Obesity: BMI of 35.6   Discharge Diagnoses:  Principal Problem:   Tachycardia Active Problems:   Mediastinal lymphadenopathy    Discharge Instructions  Discharge Instructions     Diet - low sodium heart healthy   Complete by: As directed    Discharge instructions   Complete by: As directed    1)Please take prescribed medications as instructed 2)Monitor your blood pressure at home 3)You will be called by pulmonology for follow-up appointment with pathology report 4)You might be called by cardiology for outpatient follow-up   Increase activity slowly   Complete by: As directed       Allergies as of 07/15/2023       Reactions   Penicillins Hives, Nausea And Vomiting, Nausea Only        Medication List     STOP taking these medications    azithromycin 250 MG tablet Commonly known as: Zithromax Z-Pak       TAKE these medications    acetaminophen 500 MG tablet Commonly known as: TYLENOL Take 1,000 mg by mouth every 6 (six) hours as needed for mild pain (pain score 1-3), moderate pain (pain score 4-6) or fever.   amLODipine 10 MG tablet Commonly known as: NORVASC Take 1 tablet (10 mg total) by mouth daily. Start taking on: July 16, 2023   carvedilol 25 MG tablet Commonly known as: COREG Take 1 tablet (25 mg total) by mouth 2 (two) times daily with a meal.   cetirizine 10 MG tablet Commonly known as: ZYRTEC Take 1 tablet (10 mg total) by mouth  daily.   fluticasone 50 MCG/ACT nasal spray Commonly known as: FLONASE Place 1 spray into both nostrils daily. What changed:  when to take this reasons to take this   Mucus Relief DM 30-600 MG Tb12 Take 1 tablet by mouth 2 (two) times  daily as needed for cough. What changed:  when to take this reasons to take this   multivitamin tablet Take 1 tablet by mouth daily.   Omron 3 Series BP Monitor Devi Use to check blood pressure   VITAMIN B-12 PO Take 1 tablet by mouth daily.   VITAMIN D (CHOLECALCIFEROL) PO Take 1 tablet by mouth daily.        Follow-up Information     PCP. Schedule an appointment as soon as possible for a visit in 1 week(s).                 Allergies  Allergen Reactions   Penicillins Hives, Nausea And Vomiting and Nausea Only    Consultations: Pulmonology, cardiology   Procedures/Studies: CT ABDOMEN PELVIS W CONTRAST Result Date: 07/14/2023 CLINICAL DATA:  Lymphadenopathy, groin. EXAM: CT ABDOMEN AND PELVIS WITH CONTRAST TECHNIQUE: Multidetector CT imaging of the abdomen and pelvis was performed using the standard protocol following bolus administration of intravenous contrast. RADIATION DOSE REDUCTION: This exam was performed according to the departmental dose-optimization program which includes automated exposure control, adjustment of the mA and/or kV according to patient size and/or use of iterative reconstruction technique. CONTRAST:  OMNIPAQUE IOHEXOL 300 MG/ML  SOLN COMPARISON:  CT angiography chest from yesterday-07/13/2023. FINDINGS: Lower chest: There are several irregular nonspecific peribronchovascular opacities in the visualized bilateral lungs. Please refer to CT angiography chest from yesterday for details. Hepatobiliary: The liver is normal in size. Non-cirrhotic configuration. There is moderate diffuse hepatic steatosis. There is a single 9 x 9 mm hypoattenuating lesion in the right hepatic lobe, segment 8, which is incompletely characterized on the current exam. Further evaluation with nonemergent MRI abdomen as per liver mass protocol is recommended. No intrahepatic or extrahepatic bile duct dilation. There are dependent hyperattenuating areas in the gallbladder,  likely vicarious excretion of previously administered contrast. Normal gallbladder wall thickness. No pericholecystic inflammatory changes. Pancreas: Unremarkable. No pancreatic ductal dilatation or surrounding inflammatory changes. Spleen: Within normal limits. No focal lesion. Adrenals/Urinary Tract: Adrenal glands are unremarkable. No suspicious renal mass. No hydronephrosis. No renal or ureteric calculi. Unremarkable urinary bladder. Stomach/Bowel: No disproportionate dilation of the small or large bowel loops. No evidence of abnormal bowel wall thickening or inflammatory changes. The appendix is unremarkable. Vascular/Lymphatic: No ascites or pneumoperitoneum. No abdominal or pelvic lymphadenopathy, by size criteria. No inguinal lymphadenopathy. No aneurysmal dilation of the major abdominal arteries. There are minimal peripheral atherosclerotic vascular calcifications of the aorta and its major branches. Reproductive: There is a bulky/lobulated anteverted uterus containing multiple ill-defined hypoattenuating masses, favored to represent leiomyomas. However, please note these are not well evaluated on the CT scan exam. Further evaluation with nonemergent pelvic ultrasound is recommended. No large adnexal mass. Other: There is a tiny fat containing umbilical hernia. The soft tissues and abdominal wall are otherwise unremarkable. Musculoskeletal: No suspicious osseous lesions. There are mild multilevel degenerative changes in the visualized spine. IMPRESSION: 1. No abdominopelvic lymphadenopathy by size criteria. No inguinal lymphadenopathy. 2. There is a single 9 x 9 mm hypoattenuating lesion in the right hepatic lobe, segment 8, which is incompletely characterized on the current exam. Further evaluation with nonemergent MRI abdomen as per liver mass protocol is recommended. 3.  There are several irregular nonspecific peribronchovascular opacities in the visualized bilateral lungs. Please refer to CT angiography  chest report from yesterday for additional details. 4. Multiple other nonacute observations, as described above. Electronically Signed   By: Jules Schick M.D.   On: 07/14/2023 13:49   ECHOCARDIOGRAM COMPLETE Result Date: 07/13/2023    ECHOCARDIOGRAM REPORT   Patient Name:   DARIANA GARBETT Date of Exam: 07/13/2023 Medical Rec #:  [id number]            Height:       66.0 in Accession #:    [id number]           Weight:       220.0 lb Date of Birth:  [birth year]            BSA:          2.082 m Patient Age:    52 years             BP:           161/96 mmHg Patient Gender: F                    HR:           93 bpm. Exam Location:  Inpatient Procedure: 2D Echo, Cardiac Doppler and Color Doppler (Both Spectral and Color            Flow Doppler were utilized during procedure). Indications:    Cardiomyopathy-unspecified  History:        Patient has no prior history of Echocardiogram examinations.                 Arrythmias:Tachycardia; Signs/Symptoms:Fatigue.  Sonographer:    Vern Claude Referring Phys: [phone] ANTHONY ALLEN IMPRESSIONS  1. Left ventricular ejection fraction, by estimation, is 70 to 75%. The left ventricle has hyperdynamic function. The left ventricle has no regional wall motion abnormalities. Left ventricular diastolic parameters are consistent with Grade I diastolic dysfunction (impaired relaxation).  2. Right ventricular systolic function is normal. The right ventricular size is normal.  3. The mitral valve is normal in structure. Trivial mitral valve regurgitation. No evidence of mitral stenosis.  4. The aortic valve is normal in structure. Aortic valve regurgitation is trivial. No aortic stenosis is present.  5. The inferior vena cava is normal in size with greater than 50% respiratory variability, suggesting right atrial pressure of 3 mmHg. FINDINGS  Left Ventricle: Left ventricular ejection fraction, by estimation, is 70 to 75%. The left ventricle has hyperdynamic function. The left ventricle  has no regional wall motion abnormalities. Strain imaging was not performed. The left ventricular internal cavity size was normal in size. There is no left ventricular hypertrophy. Left ventricular diastolic parameters are consistent with Grade I diastolic dysfunction (impaired relaxation). Right Ventricle: The right ventricular size is normal. No increase in right ventricular wall thickness. Right ventricular systolic function is normal. Left Atrium: Left atrial size was normal in size. Right Atrium: Right atrial size was normal in size. Pericardium: There is no evidence of pericardial effusion. Mitral Valve: The mitral valve is normal in structure. Trivial mitral valve regurgitation. No evidence of mitral valve stenosis. MV peak gradient, 4.2 mmHg. The mean mitral valve gradient is 2.0 mmHg. Tricuspid Valve: The tricuspid valve is normal in structure. Tricuspid valve regurgitation is trivial. No evidence of tricuspid stenosis. Aortic Valve: The aortic valve is normal in structure. Aortic valve regurgitation is trivial. No aortic stenosis is present. Aortic valve  mean gradient measures 3.0 mmHg. Aortic valve peak gradient measures 5.1 mmHg. Aortic valve area, by VTI measures 3.18 cm. Pulmonic Valve: The pulmonic valve was normal in structure. Pulmonic valve regurgitation is trivial. No evidence of pulmonic stenosis. Aorta: The aortic root is normal in size and structure. Venous: The inferior vena cava is normal in size with greater than 50% respiratory variability, suggesting right atrial pressure of 3 mmHg. IAS/Shunts: No atrial level shunt detected by color flow Doppler. Additional Comments: 3D imaging was not performed.  LEFT VENTRICLE PLAX 2D LVIDd:         3.80 cm      Diastology LVIDs:         2.80 cm      LV e' medial:    18.30 cm/s LV PW:         0.80 cm      LV E/e' medial:  3.3 LV IVS:        1.10 cm      LV e' lateral:   9.79 cm/s LVOT diam:     2.00 cm      LV E/e' lateral: 6.1 LV SV:         53 LV SV  Index:   25 LVOT Area:     3.14 cm  LV Volumes (MOD) LV vol d, MOD A2C: 106.0 ml LV vol d, MOD A4C: 91.5 ml LV vol s, MOD A2C: 32.3 ml LV vol s, MOD A4C: 31.0 ml LV SV MOD A2C:     73.7 ml LV SV MOD A4C:     91.5 ml LV SV MOD BP:      73.0 ml RIGHT VENTRICLE             IVC RV Basal diam:  3.10 cm     IVC diam: 1.20 cm RV Mid diam:    2.40 cm RV S prime:     21.40 cm/s LEFT ATRIUM             Index        RIGHT ATRIUM           Index LA diam:        2.90 cm 1.39 cm/m   RA Area:     11.30 cm LA Vol (A2C):   20.0 ml 9.60 ml/m   RA Volume:   26.10 ml  12.53 ml/m LA Vol (A4C):   23.1 ml 11.09 ml/m LA Biplane Vol: 22.2 ml 10.66 ml/m  AORTIC VALVE                    PULMONIC VALVE AV Area (Vmax):    2.59 cm     PV Vmax:       1.01 m/s AV Area (Vmean):   2.79 cm     PV Peak grad:  4.0 mmHg AV Area (VTI):     3.18 cm AV Vmax:           113.00 cm/s AV Vmean:          76.400 cm/s AV VTI:            0.167 m AV Peak Grad:      5.1 mmHg AV Mean Grad:      3.0 mmHg LVOT Vmax:         93.00 cm/s LVOT Vmean:        67.800 cm/s LVOT VTI:          0.169 m LVOT/AV VTI ratio: 1.01  AORTA Ao Root diam: 3.00 cm Ao Asc diam:  3.20 cm MITRAL VALVE MV Area (PHT): 4.60 cm     SHUNTS MV Area VTI:   3.47 cm     Systemic VTI:  0.17 m MV Peak grad:  4.2 mmHg     Systemic Diam: 2.00 cm MV Mean grad:  2.0 mmHg MV Vmax:       1.02 m/s MV Vmean:      71.7 cm/s MV Decel Time: 165 msec MV E velocity: 60.00 cm/s MV A velocity: 102.00 cm/s MV E/A ratio:  0.59 Arvilla Meres MD Electronically signed by Arvilla Meres MD Signature Date/Time: 07/13/2023/4:27:42 PM    Final    CT Angio Chest PE W/Cm &/Or Wo Cm Result Date: 07/13/2023 CLINICAL DATA:  Pulmonary embolism (PE) suspected, high prob. Palpitations. Heaviness. EXAM: CT ANGIOGRAPHY CHEST WITH CONTRAST TECHNIQUE: Multidetector CT imaging of the chest was performed using the standard protocol during bolus administration of intravenous contrast. Multiplanar CT image reconstructions  and MIPs were obtained to evaluate the vascular anatomy. RADIATION DOSE REDUCTION: This exam was performed according to the departmental dose-optimization program which includes automated exposure control, adjustment of the mA and/or kV according to patient size and/or use of iterative reconstruction technique. CONTRAST:  80mL OMNIPAQUE IOHEXOL 350 MG/ML SOLN COMPARISON:  None Available. FINDINGS: Cardiovascular: No evidence of embolism to the proximal subsegmental pulmonary artery level. Normal cardiac size. No pericardial effusion. No aortic aneurysm. Mediastinum/Nodes: Enlarged and heterogeneous thyroid gland however without discrete nodule, not well evaluated on this exam. No solid / cystic mediastinal masses. The esophagus is nondistended precluding optimal assessment. There are innumerable enlarged mediastinal and hilar lymph nodes with largest in the subcarinal region measuring up to 1.9 x 2.4 cm and largest in the left hilum measuring up to 1.4 x 1.9 cm. No axillary lymphadenopathy by size criteria. Lungs/Pleura: The central tracheo-bronchial tree is patent. There are multiple solid noncalcified pulmonary nodules ranging in size from few mm up to 1.8 cm, predominantly in peribronchovascular location and mainly the upper lobes. No pleural effusion or pneumothorax. Upper Abdomen: Visualized upper abdominal viscera within normal limits. Musculoskeletal: The visualized soft tissues of the chest wall are grossly unremarkable. No suspicious osseous lesions. There are mild multilevel degenerative changes in the visualized spine. Review of the MIP images confirms the above findings. IMPRESSION: 1. No embolism to the proximal subsegmental pulmonary artery level. 2. Multiple solid noncalcified pulmonary nodules, predominantly in peribronchovascular location and mainly in the upper lobes. There is associated mediastinal and hilar lymphadenopathy. Differential diagnosis includes sarcoidosis, lymphoproliferative disease,  metastases, etc. 3. Multiple other nonacute observations, as described above. Electronically Signed   By: Jules Schick M.D.   On: 07/13/2023 13:58      Subjective: Patient seen and examined at bedside today.  Hemodynamically stable.  Comfortable.  Denies any shortness of breath or cough.  Heart rate and blood pressure stable this morning.  No shortness of breath.  Discharge Exam: Vitals:   07/15/23 1245 07/15/23 1313  BP:  128/73  Pulse: 86 74  Resp: 18 18  Temp:  98.6 F (37 C)  SpO2: 100% 98%   Vitals:   07/15/23 1235 07/15/23 1240 07/15/23 1245 07/15/23 1313  BP:  (!) 139/90  128/73  Pulse: 96 87 86 74  Resp: (!) 25 18 18 18   Temp:    98.6 F (37 C)  TempSrc:    Oral  SpO2: 91% 97% 100% 98%  Weight:      Height:  General: Pt is alert, awake, not in acute distress,obese Cardiovascular: RRR, S1/S2 +, no rubs, no gallops Respiratory: CTA bilaterally, no wheezing, no rhonchi Abdominal: Soft, NT, ND, bowel sounds + Extremities: no edema, no cyanosis    The results of significant diagnostics from this hospitalization (including imaging, microbiology, ancillary and laboratory) are listed below for reference.     Microbiology: Recent Results (from the past 240 hours)  Resp panel by RT-PCR (RSV, Flu A&B, Covid) Anterior Nasal Swab     Status: None   Collection Time: 07/13/23  8:27 AM   Specimen: Anterior Nasal Swab  Result Value Ref Range Status   SARS Coronavirus 2 by RT PCR NEGATIVE NEGATIVE Final    Comment: (NOTE) SARS-CoV-2 target nucleic acids are NOT DETECTED.  The SARS-CoV-2 RNA is generally detectable in upper respiratory specimens during the acute phase of infection. The lowest concentration of SARS-CoV-2 viral copies this assay can detect is 138 copies/mL. A negative result does not preclude SARS-Cov-2 infection and should not be used as the sole basis for treatment or other patient management decisions. A negative result may occur with  improper  specimen collection/handling, submission of specimen other than nasopharyngeal swab, presence of viral mutation(s) within the areas targeted by this assay, and inadequate number of viral copies(<138 copies/mL). A negative result must be combined with clinical observations, patient history, and epidemiological information. The expected result is Negative.  Fact Sheet for Patients:  BloggerCourse.com  Fact Sheet for Healthcare Providers:  SeriousBroker.it  This test is no t yet approved or cleared by the [place] FDA and  has been authorized for detection and/or diagnosis of SARS-CoV-2 by FDA under an Emergency Use Authorization (EUA). This EUA will remain  in effect (meaning this test can be used) for the duration of the COVID-19 declaration under Section 564(b)(1) of the Act, 21 U.S.C.section 360bbb-3(b)(1), unless the authorization is terminated  or revoked sooner.       Influenza A by PCR NEGATIVE NEGATIVE Final   Influenza B by PCR NEGATIVE NEGATIVE Final    Comment: (NOTE) The Xpert Xpress SARS-CoV-2/FLU/RSV plus assay is intended as an aid in the diagnosis of influenza from Nasopharyngeal swab specimens and should not be used as a sole basis for treatment. Nasal washings and aspirates are unacceptable for Xpert Xpress SARS-CoV-2/FLU/RSV testing.  Fact Sheet for Patients: BloggerCourse.com  Fact Sheet for Healthcare Providers: SeriousBroker.it  This test is not yet approved or cleared by the [place] FDA and has been authorized for detection and/or diagnosis of SARS-CoV-2 by FDA under an Emergency Use Authorization (EUA). This EUA will remain in effect (meaning this test can be used) for the duration of the COVID-19 declaration under Section 564(b)(1) of the Act, 21 U.S.C. section 360bbb-3(b)(1), unless the authorization is terminated or revoked.     Resp  Syncytial Virus by PCR NEGATIVE NEGATIVE Final    Comment: (NOTE) Fact Sheet for Patients: BloggerCourse.com  Fact Sheet for Healthcare Providers: SeriousBroker.it  This test is not yet approved or cleared by the [place] FDA and has been authorized for detection and/or diagnosis of SARS-CoV-2 by FDA under an Emergency Use Authorization (EUA). This EUA will remain in effect (meaning this test can be used) for the duration of the COVID-19 declaration under Section 564(b)(1) of the Act, 21 U.S.C. section 360bbb-3(b)(1), unless the authorization is terminated or revoked.  Performed at [facility], [address] [place], [place] [address]      Labs: BNP (last 3 results)  No results for input(s): "BNP" in the last 8760 hours. Basic Metabolic Panel: Recent Labs  Lab 07/13/23 0827  NA 138  K 3.7  CL 105  CO2 24  GLUCOSE 160*  BUN 9  CREATININE 0.73  CALCIUM 9.6   Liver Function Tests: No results for input(s): "AST", "ALT", "ALKPHOS", "BILITOT", "PROT", "ALBUMIN" in the last 168 hours. No results for input(s): "LIPASE", "AMYLASE" in the last 168 hours. No results for input(s): "AMMONIA" in the last 168 hours. CBC: Recent Labs  Lab 07/13/23 0827  WBC 5.5  NEUTROABS 3.3  HGB 14.4  HCT 45.1  MCV 87.9  PLT 285   Cardiac Enzymes: No results for input(s): "CKTOTAL", "CKMB", "CKMBINDEX", "TROPONINI" in the last 168 hours. BNP: Invalid input(s): "POCBNP" CBG: No results for input(s): "GLUCAP" in the last 168 hours. D-Dimer No results for input(s): "DDIMER" in the last 72 hours. Hgb A1c No results for input(s): "HGBA1C" in the last 72 hours. Lipid Profile No results for input(s): "CHOL", "HDL", "LDLCALC", "TRIG", "CHOLHDL", "LDLDIRECT" in the last 72 hours. Thyroid function studies Recent Labs    07/13/23 0827  TSH 1.403   Anemia work up No results for input(s): "VITAMINB12", "FOLATE",  "FERRITIN", "TIBC", "IRON", "RETICCTPCT" in the last 72 hours. Urinalysis    Component Value Date/Time   COLORURINE YELLOW 02/23/2011 0847   APPEARANCEUR CLEAR 02/23/2011 0847   LABSPEC <1.005 (L) 02/23/2011 0847   PHURINE 5.0 09/24/2011 1240   GLUCOSEU NEGATIVE 02/23/2011 0847   HGBUR NEGATIVE 02/23/2011 0847   BILIRUBINUR NEGATIVE 02/23/2011 0847   KETONESUR NEGATIVE 02/23/2011 0847   PROTEINUR NEGATIVE 02/23/2011 0847   UROBILINOGEN 0.2 02/23/2011 0847   NITRITE NEGATIVE 02/23/2011 0847   LEUKOCYTESUR NEGATIVE 02/23/2011 0847   Sepsis Labs Recent Labs  Lab 07/13/23 0827  WBC 5.5   Microbiology Recent Results (from the past 240 hours)  Resp panel by RT-PCR (RSV, Flu A&B, Covid) Anterior Nasal Swab     Status: None   Collection Time: 07/13/23  8:27 AM   Specimen: Anterior Nasal Swab  Result Value Ref Range Status   SARS Coronavirus 2 by RT PCR NEGATIVE NEGATIVE Final    Comment: (NOTE) SARS-CoV-2 target nucleic acids are NOT DETECTED.  The SARS-CoV-2 RNA is generally detectable in upper respiratory specimens during the acute phase of infection. The lowest concentration of SARS-CoV-2 viral copies this assay can detect is 138 copies/mL. A negative result does not preclude SARS-Cov-2 infection and should not be used as the sole basis for treatment or other patient management decisions. A negative result may occur with  improper specimen collection/handling, submission of specimen other than nasopharyngeal swab, presence of viral mutation(s) within the areas targeted by this assay, and inadequate number of viral copies(<138 copies/mL). A negative result must be combined with clinical observations, patient history, and epidemiological information. The expected result is Negative.  Fact Sheet for Patients:  BloggerCourse.com  Fact Sheet for Healthcare Providers:  SeriousBroker.it  This test is no t yet approved or cleared  by the [place] FDA and  has been authorized for detection and/or diagnosis of SARS-CoV-2 by FDA under an Emergency Use Authorization (EUA). This EUA will remain  in effect (meaning this test can be used) for the duration of the COVID-19 declaration under Section 564(b)(1) of the Act, 21 U.S.C.section 360bbb-3(b)(1), unless the authorization is terminated  or revoked sooner.       Influenza A by PCR NEGATIVE NEGATIVE Final   Influenza B by PCR NEGATIVE NEGATIVE Final  Comment: (NOTE) The Xpert Xpress SARS-CoV-2/FLU/RSV plus assay is intended as an aid in the diagnosis of influenza from Nasopharyngeal swab specimens and should not be used as a sole basis for treatment. Nasal washings and aspirates are unacceptable for Xpert Xpress SARS-CoV-2/FLU/RSV testing.  Fact Sheet for Patients: BloggerCourse.com  Fact Sheet for Healthcare Providers: SeriousBroker.it  This test is not yet approved or cleared by the [place] FDA and has been authorized for detection and/or diagnosis of SARS-CoV-2 by FDA under an Emergency Use Authorization (EUA). This EUA will remain in effect (meaning this test can be used) for the duration of the COVID-19 declaration under Section 564(b)(1) of the Act, 21 U.S.C. section 360bbb-3(b)(1), unless the authorization is terminated or revoked.     Resp Syncytial Virus by PCR NEGATIVE NEGATIVE Final    Comment: (NOTE) Fact Sheet for Patients: BloggerCourse.com  Fact Sheet for Healthcare Providers: SeriousBroker.it  This test is not yet approved or cleared by the [place] FDA and has been authorized for detection and/or diagnosis of SARS-CoV-2 by FDA under an Emergency Use Authorization (EUA). This EUA will remain in effect (meaning this test can be used) for the duration of the COVID-19 declaration under Section 564(b)(1) of the Act, 21  U.S.C. section 360bbb-3(b)(1), unless the authorization is terminated or revoked.  Performed at [facility], [address] [place], [place] [address]     Please note: You were cared for by a hospitalist during your hospital stay. Once you are discharged, your primary care physician will handle any further medical issues. Please note that NO REFILLS for any discharge medications will be authorized once you are discharged, as it is imperative that you return to your primary care physician (or establish a relationship with a primary care physician if you do not have one) for your post hospital discharge needs so that they can reassess your need for medications and monitor your lab values.    Time coordinating discharge: 40 minutes  SIGNED:   Burnadette Pop, MD  [facility] 07/15/2023, 3:40 PM Pager [phone]  If 7PM-7AM, please contact night-coverage [email or web address] Password [email or web address]

## 2023-07-15 NOTE — Telephone Encounter (Signed)
 Hospital FU appt  in 4 wks with APP please

## 2023-07-15 NOTE — Op Note (Signed)
  Name:  Brenda Valencia MRN:  [id number] DOB:  02-27-1971  PROCEDURE NOTE  Procedure(s): Flexible bronchoscopy [phone]) Bronchial alveolar lavage [phone]) of the LLL Endobronchial ultrasound ([id number]) Transbronchial needle aspiration (41324) of 10 L station  Transbronchial needle aspiration, additional lobe (40102) of the station 7  Indications:  Hilar / mediastinal lymphadenopathy.  Consent:  Written informed consent was obtained prior to the procedure. The risks of the procedure including coughing, bleeding and the small chance of lung puncture requiring chest tube were discussed in great detail. The benefits & alternatives including serial follow up were also discussed.  Anesthesia:  General endotracheal.  Procedure summary:  Appropriate equipment was assembled.  The patient was  identified as Brenda Valencia. Interim history obtained and brought to the operating room. Safety timeout was performed. The patient was placed supine on the operating table, airway established and general anesthesia administered by Anesthesia team.   After the appropriate level of anesthesia was assured, flexible video bronchoscope was lubricated and inserted through the endotracheal tube.    Airway examination was performed bilaterally to subsegmental level.  Minimal clear secretions were noted, mucosa appeared normal and no endobronchial lesions were identified. BAL obtained from LLL  Endobronchial ultrasound video bronchoscope was then lubricated and inserted through the endotracheal tube. Surveillance of the mediastinal and and bilateral hilar lymph node stations was performed.  Pathologically enlarged lymph nodes were noted.  Endobronchial ultrasound guided transbronchial needle aspiration of station 10 L  (passes x 3), station 7 (passes x3) a was performed, after which EBUS bronchoscope was withdrawn.  Flexible video bronchoscope was used again to perform random endobronchial mucosal biopsies.   After ensuring hemostasis , the bronchoscope was withdrawn.  The patient was extubated in operating room and transferred to PACU.   Specimens sent: Bronchial alveolar lavage specimen of the LLL for  microbiology and cytology. TBNA x 2 for cytology  Complications:  No immediate complications were noted.  Hemodynamic parameters and oxygenation remained stable throughout the procedure.  Estimated blood loss:  Less then 5 mL.   Cyril Mourning MD. Tonny Bollman. [facility] Pager [phone] If no response call [phone]   07/15/2023 11:40 AM

## 2023-07-15 NOTE — Transfer of Care (Signed)
 Immediate Anesthesia Transfer of Care Note  Patient: Brenda Valencia  Procedure(s) Performed: ENDOBRONCHIAL ULTRASOUND (Bilateral) BRONCHIAL WASHINGS BRONCHIAL NEEDLE ASPIRATION BIOPSIES VIDEO BRONCHOSCOPY  Patient Location: PACU  Anesthesia Type:General  Level of Consciousness: awake, alert , oriented, and patient cooperative  Airway & Oxygen Therapy: Patient Spontanous Breathing and Patient connected to face mask oxygen  Post-op Assessment: Report given to RN and Post -op Vital signs reviewed and stable  Post vital signs: Reviewed and stable  Last Vitals:  Vitals Value Taken Time  BP 136/53 07/15/23 1221  Temp    Pulse 76 07/15/23 1223  Resp 17 07/15/23 1223  SpO2 97 % 07/15/23 1223  Vitals shown include unfiled device data.  Last Pain:  Vitals:   07/15/23 1013  TempSrc: Temporal  PainSc: 2       Patients Stated Pain Goal: 0 (07/15/23 1013)  Complications: No notable events documented.

## 2023-07-15 NOTE — Consult Note (Signed)
 NAME:  Brenda Valencia, MRN:  [id number], DOB:  1971/01/27, LOS: 0 ADMISSION DATE:  07/13/2023, CONSULTATION DATE:  2/26 REFERRING MD:  Renford Dills TRH, CHIEF COMPLAINT: Mediastinal lymphadenopathy  History of Present Illness:  53 year old woman never smoker admitted for tachycardia. She reports seasonal allergy for which she takes Zyrtec.  She had URI symptoms in the first week of February with subsided except for a lingering cough.  She developed tachycardia and on ED arrival was noted to be in sinus tachycardia 115/min with blood pressure of 213/110 with elevated troponin Respiratory viral panel was negative for COVID, flu and RSV. CT angiogram was negative for PE but showed multiple pulmonary nodules largest 1.8 cm in peribronchovascular location mainly upper lobes.  There was mediastinal and hilar lymphadenopathy, largest subcarinal 1.9 x 2.4 cm and left hilum. Hence PCCM consulted. She denies significant arthritis, redness of eyes or skin nodules No prior imaging to compare  Cardiology consult felt that elevated troponins was related to demand ischemia in the setting of hypertensive urgency, beta-blocker restarted, echo was normal  Pertinent  Medical History  Seasonal allergies  Significant Hospital Events: Including procedures, antibiotic start and stop dates in addition to other pertinent events     Interim History / Subjective:  Denies chest pain or dyspnea  Objective   Blood pressure (!) 155/85, pulse 73, temperature 97.7 F (36.5 C), temperature source Temporal, resp. rate 16, height 5\' 6"  (1.676 m), weight 100.1 kg, SpO2 98%.        Intake/Output Summary (Last 24 hours) at 07/15/2023 1036 Last data filed at 07/15/2023 0900 Gross per 24 hour  Intake 2147.6 ml  Output --  Net 2147.6 ml   Filed Weights   07/14/23 1508  Weight: 100.1 kg    Examination: Gen. Pleasant, obese, in no distress, normal affect ENT - no pallor,icterus, no post nasal drip, class 2-3  airway Neck: No JVD, no thyromegaly, no carotid bruits Lungs: no use of accessory muscles, no dullness to percussion, decreased without rales or rhonchi  Cardiovascular: Rhythm regular, heart sounds  normal, no murmurs or gallops, no peripheral edema Abdomen: soft and non-tender, no hepatosplenomegaly, BS normal. Musculoskeletal: No deformities, no cyanosis or clubbing Neuro:  alert, non focal, no tremors   Resolved Hospital Problem list     Assessment & Plan:  Mediastinal and hilar lymphadenopathy with multiple pulmonary nodules -this is likely related to undiagnosed sarcoidosis given peribronchovascular distribution of nodules.  Differential diagnosis of lymphoproliferative disease or malignancy exists but less likely  -I discussed possible differentials and approach including wait and watch versus bronchoscopy with EBUS.  She was willing to proceed.  Risks and benefits of procedure including daughter bleeding, small chance of lung cancer requiring a chest tube were discussed in detail. Procedure planned for tomorrow at 11 AM   Sinus tachycardia/hypertensive urgency/elevated troponin -Per TRH. Cardiology has cleared her for bronchoscopy procedure  Best Practice (right click and "Reselect all SmartList Selections" daily)    Code Status:  full code Last date of multidisciplinary goals of care discussion [NA]  Labs   CBC: Recent Labs  Lab 07/13/23 0827  WBC 5.5  NEUTROABS 3.3  HGB 14.4  HCT 45.1  MCV 87.9  PLT 285    Basic Metabolic Panel: Recent Labs  Lab 07/13/23 0827  NA 138  K 3.7  CL 105  CO2 24  GLUCOSE 160*  BUN 9  CREATININE 0.73  CALCIUM 9.6   GFR: Estimated Creatinine Clearance: 97.1 mL/min (by C-G formula based  on SCr of 0.73 mg/dL). Recent Labs  Lab 07/13/23 0827  WBC 5.5    Liver Function Tests: No results for input(s): "AST", "ALT", "ALKPHOS", "BILITOT", "PROT", "ALBUMIN" in the last 168 hours. No results for input(s): "LIPASE", "AMYLASE"  in the last 168 hours. No results for input(s): "AMMONIA" in the last 168 hours.  ABG No results found for: "PHART", "PCO2ART", "PO2ART", "HCO3", "TCO2", "ACIDBASEDEF", "O2SAT"   Coagulation Profile: No results for input(s): "INR", "PROTIME" in the last 168 hours.  Cardiac Enzymes: No results for input(s): "CKTOTAL", "CKMB", "CKMBINDEX", "TROPONINI" in the last 168 hours.  HbA1C: Hemoglobin A1C  Date/Time Value Ref Range Status  11/25/2017 10:15 AM 5.9  Final    CBG: No results for input(s): "GLUCAP" in the last 168 hours.  Review of Systems:   Constitutional: negative for anorexia, fevers and sweats  Eyes: negative for irritation, redness and visual disturbance  Ears, nose, mouth, throat, and face: negative for earaches, epistaxis, nasal congestion and sore throat  Respiratory: negative for  dyspnea on exertion, sputum and wheezing  Cardiovascular: negative for chest pain, dyspnea, lower extremity edema, orthopnea,  and syncope  Gastrointestinal: negative for abdominal pain, constipation, diarrhea, melena, nausea and vomiting  Genitourinary:negative for dysuria, frequency and hematuria  Hematologic/lymphatic: negative for bleeding, easy bruising and lymphadenopathy  Musculoskeletal:negative for arthralgias, muscle weakness and stiff joints  Neurological: negative for coordination problems, gait problems, headaches and weakness  Endocrine: negative for diabetic symptoms including polydipsia, polyuria and weight loss   Past Medical History:  She,  has a past medical history of Abnormal glucose tolerance in mother complicating pregnancy (10/19/2011), Ectopic pregnancy, Fibroids, H/O varicella, Headache(784.0), Rh negative status during pregnancy (10/19/2011), and Seasonal allergies.   Surgical History:   Past Surgical History:  Procedure Laterality Date   axillary nodes      2002   CESAREAN SECTION  07/2006   NRFHR   CYSTOSCOPY  10/19/2011   Procedure: CYSTOSCOPY;  Surgeon:  Esmeralda Arthur, MD;  Location: [facility];  Service: Gynecology;  Laterality: N/A;   TUBAL LIGATION     WISDOM TOOTH EXTRACTION     as teenager     Social History:   reports that she has never smoked. She has never used smokeless tobacco. She reports that she does not drink alcohol and does not use drugs.   Family History:  Her family history includes Alcohol abuse in her brother and father; Arthritis in her mother; Cancer in her mother; Heart disease in her father; Hypertension in her father and mother.   Allergies Allergies  Allergen Reactions   Penicillins Hives, Nausea And Vomiting and Nausea Only     Home Medications  Prior to Admission medications   Medication Sig Start Date End Date Taking? Authorizing Provider  acetaminophen (TYLENOL) 500 MG tablet Take 1,000 mg by mouth every 6 (six) hours as needed for mild pain (pain score 1-3), moderate pain (pain score 4-6) or fever.   Yes [provider]  cetirizine (ZYRTEC) 10 MG tablet Take 1 tablet (10 mg total) by mouth daily. 02/03/21  Yes [place], Rolly Salter E, FNP  Cyanocobalamin (VITAMIN B-12 PO) Take 1 tablet by mouth daily.   Yes [provider]  fluticasone (FLONASE) 50 MCG/ACT nasal spray Place 1 spray into both nostrils daily. Patient taking differently: Place 1 spray into both nostrils as needed for allergies. 06/12/22  Yes Ellsworth Lennox, PA-C  Multiple Vitamin (MULTIVITAMIN) tablet Take 1 tablet by mouth daily.   Yes [provider]  VITAMIN D, CHOLECALCIFEROL, PO Take 1 tablet by mouth daily.   Yes [provider]  azithromycin (ZITHROMAX Z-PAK) 250 MG tablet 2 tablets initially and then 1 tablet daily until completed. Patient not taking: Reported on 07/13/2023 06/12/22   Ellsworth Lennox, PA-C  diphenhydrAMINE (BENADRYL) 25 MG tablet Take 25 mg by mouth every 6 (six) hours as needed.  11/26/19  [provider]       Cyril Mourning MD. FCCP. [facility] Pager : [phone]  If no response to pager , please call [phone] until 7 pm After 7:00 pm call Elink  [phone]   07/15/2023

## 2023-07-15 NOTE — Plan of Care (Signed)

## 2023-07-15 NOTE — Anesthesia Preprocedure Evaluation (Addendum)
 Anesthesia Evaluation  Patient identified by MRN, date of birth, ID band Patient awake    Reviewed: Allergy & Precautions, NPO status , Patient's Chart, lab work & pertinent test results  Airway Mallampati: III  TM Distance: >3 FB     Dental  (+) Dental Advisory Given, Caps, Teeth Intact, Chipped   Pulmonary  Abnormal CT of chest CT scan showed possible lymphoproliferative disease versus sarcoidosis versus metastasis   Pulmonary exam normal breath sounds clear to auscultation       Cardiovascular negative cardio ROS Normal cardiovascular exam Rhythm:Regular Rate:Normal  Echo 07/13/23  1. Left ventricular ejection fraction, by estimation, is 70 to 75%. The  left ventricle has hyperdynamic function. The left ventricle has no  regional wall motion abnormalities. Left ventricular diastolic parameters  are consistent with Grade I diastolic  dysfunction (impaired relaxation).   2. Right ventricular systolic function is normal. The right ventricular  size is normal.   3. The mitral valve is normal in structure. Trivial mitral valve  regurgitation. No evidence of mitral stenosis.   4. The aortic valve is normal in structure. Aortic valve regurgitation is  trivial. No aortic stenosis is present.   5. The inferior vena cava is normal in size with greater than 50%  respiratory variability, suggesting right atrial pressure of 3 mmHg.    EKG 07/13/23 NSR, borderline prolonged QT   Neuro/Psych  Headaches  negative psych ROS   GI/Hepatic negative GI ROS, Neg liver ROS,,,  Endo/Other  Obesity  Renal/GU negative Renal ROS  negative genitourinary   Musculoskeletal negative musculoskeletal ROS (+)    Abdominal  (+) + obese  Peds  Hematology Last Lovenox 40mg  yesterday   Anesthesia Other Findings   Reproductive/Obstetrics                             Anesthesia Physical Anesthesia Plan  ASA:  2  Anesthesia Plan: General   Post-op Pain Management: Minimal or no pain anticipated   Induction: Intravenous  PONV Risk Score and Plan: 4 or greater and Treatment may vary due to age or medical condition and Ondansetron  Airway Management Planned: Oral ETT  Additional Equipment: None  Intra-op Plan:   Post-operative Plan: Extubation in OR  Informed Consent: I have reviewed the patients History and Physical, chart, labs and discussed the procedure including the risks, benefits and alternatives for the proposed anesthesia with the patient or authorized representative who has indicated his/her understanding and acceptance.     Dental advisory given  Plan Discussed with: CRNA and Anesthesiologist  Anesthesia Plan Comments:         Anesthesia Quick Evaluation

## 2023-07-15 NOTE — Plan of Care (Signed)
  Problem: Education: Goal: Knowledge of General Education information will improve Description: Including pain rating scale, medication(s)/side effects and non-pharmacologic comfort measures Outcome: Adequate for Discharge   Problem: Health Behavior/Discharge Planning: Goal: Ability to manage health-related needs will improve Outcome: Adequate for Discharge   Problem: Clinical Measurements: Goal: Ability to maintain clinical measurements within normal limits will improve Outcome: Adequate for Discharge   Problem: Clinical Measurements: Goal: Will remain free from infection Outcome: Adequate for Discharge   Problem: Clinical Measurements: Goal: Respiratory complications will improve Outcome: Adequate for Discharge   Problem: Clinical Measurements: Goal: Cardiovascular complication will be avoided Outcome: Adequate for Discharge   

## 2023-07-15 NOTE — Anesthesia Postprocedure Evaluation (Signed)
 Anesthesia Post Note  Patient: Brenda Valencia  Procedure(s) Performed: ENDOBRONCHIAL ULTRASOUND (Bilateral) BRONCHIAL WASHINGS BRONCHIAL NEEDLE ASPIRATION BIOPSIES VIDEO BRONCHOSCOPY     Patient location during evaluation: PACU Anesthesia Type: General Level of consciousness: awake and alert and oriented Pain management: pain level controlled Vital Signs Assessment: post-procedure vital signs reviewed and stable Respiratory status: spontaneous breathing, nonlabored ventilation and respiratory function stable Cardiovascular status: blood pressure returned to baseline and stable Postop Assessment: no apparent nausea or vomiting Anesthetic complications: no   No notable events documented.  Last Vitals:  Vitals:   07/15/23 1240 07/15/23 1245  BP: (!) 139/90   Pulse: 87 86  Resp: 18 18  Temp:    SpO2: 97% 100%    Last Pain:  Vitals:   07/15/23 1245  TempSrc:   PainSc: 0-No pain                 Paris Hohn A.

## 2023-07-15 NOTE — Anesthesia Procedure Notes (Signed)
 Procedure Name: Intubation Date/Time: 07/15/2023 10:59 AM  Performed by: Ponciano Ort, CRNAPre-anesthesia Checklist: Patient identified, Emergency Drugs available, Suction available and Patient being monitored Patient Re-evaluated:Patient Re-evaluated prior to induction Oxygen Delivery Method: Circle system utilized Preoxygenation: Pre-oxygenation with 100% oxygen Induction Type: IV induction Ventilation: Mask ventilation without difficulty Laryngoscope Size: Mac and 4 Grade View: Grade II Tube type: Oral Tube size: 8.5 mm Number of attempts: 1 Airway Equipment and Method: Stylet and Oral airway Placement Confirmation: ETT inserted through vocal cords under direct vision, positive ETCO2 and breath sounds checked- equal and bilateral Secured at: 22 cm Tube secured with: Tape Dental Injury: Teeth and Oropharynx as per pre-operative assessment

## 2023-07-15 NOTE — H&P (View-Only) (Signed)
 NAME:  Brenda Valencia, MRN:  [id number], DOB:  1971/01/27, LOS: 0 ADMISSION DATE:  07/13/2023, CONSULTATION DATE:  2/26 REFERRING MD:  Renford Dills TRH, CHIEF COMPLAINT: Mediastinal lymphadenopathy  History of Present Illness:  53 year old woman never smoker admitted for tachycardia. She reports seasonal allergy for which she takes Zyrtec.  She had URI symptoms in the first week of February with subsided except for a lingering cough.  She developed tachycardia and on ED arrival was noted to be in sinus tachycardia 115/min with blood pressure of 213/110 with elevated troponin Respiratory viral panel was negative for COVID, flu and RSV. CT angiogram was negative for PE but showed multiple pulmonary nodules largest 1.8 cm in peribronchovascular location mainly upper lobes.  There was mediastinal and hilar lymphadenopathy, largest subcarinal 1.9 x 2.4 cm and left hilum. Hence PCCM consulted. She denies significant arthritis, redness of eyes or skin nodules No prior imaging to compare  Cardiology consult felt that elevated troponins was related to demand ischemia in the setting of hypertensive urgency, beta-blocker restarted, echo was normal  Pertinent  Medical History  Seasonal allergies  Significant Hospital Events: Including procedures, antibiotic start and stop dates in addition to other pertinent events     Interim History / Subjective:  Denies chest pain or dyspnea  Objective   Blood pressure (!) 155/85, pulse 73, temperature 97.7 F (36.5 C), temperature source Temporal, resp. rate 16, height 5\' 6"  (1.676 m), weight 100.1 kg, SpO2 98%.        Intake/Output Summary (Last 24 hours) at 07/15/2023 1036 Last data filed at 07/15/2023 0900 Gross per 24 hour  Intake 2147.6 ml  Output --  Net 2147.6 ml   Filed Weights   07/14/23 1508  Weight: 100.1 kg    Examination: Gen. Pleasant, obese, in no distress, normal affect ENT - no pallor,icterus, no post nasal drip, class 2-3  airway Neck: No JVD, no thyromegaly, no carotid bruits Lungs: no use of accessory muscles, no dullness to percussion, decreased without rales or rhonchi  Cardiovascular: Rhythm regular, heart sounds  normal, no murmurs or gallops, no peripheral edema Abdomen: soft and non-tender, no hepatosplenomegaly, BS normal. Musculoskeletal: No deformities, no cyanosis or clubbing Neuro:  alert, non focal, no tremors   Resolved Hospital Problem list     Assessment & Plan:  Mediastinal and hilar lymphadenopathy with multiple pulmonary nodules -this is likely related to undiagnosed sarcoidosis given peribronchovascular distribution of nodules.  Differential diagnosis of lymphoproliferative disease or malignancy exists but less likely  -I discussed possible differentials and approach including wait and watch versus bronchoscopy with EBUS.  She was willing to proceed.  Risks and benefits of procedure including daughter bleeding, small chance of lung cancer requiring a chest tube were discussed in detail. Procedure planned for tomorrow at 11 AM   Sinus tachycardia/hypertensive urgency/elevated troponin -Per TRH. Cardiology has cleared her for bronchoscopy procedure  Best Practice (right click and "Reselect all SmartList Selections" daily)    Code Status:  full code Last date of multidisciplinary goals of care discussion [NA]  Labs   CBC: Recent Labs  Lab 07/13/23 0827  WBC 5.5  NEUTROABS 3.3  HGB 14.4  HCT 45.1  MCV 87.9  PLT 285    Basic Metabolic Panel: Recent Labs  Lab 07/13/23 0827  NA 138  K 3.7  CL 105  CO2 24  GLUCOSE 160*  BUN 9  CREATININE 0.73  CALCIUM 9.6   GFR: Estimated Creatinine Clearance: 97.1 mL/min (by C-G formula based  on SCr of 0.73 mg/dL). Recent Labs  Lab 07/13/23 0827  WBC 5.5    Liver Function Tests: No results for input(s): "AST", "ALT", "ALKPHOS", "BILITOT", "PROT", "ALBUMIN" in the last 168 hours. No results for input(s): "LIPASE", "AMYLASE"  in the last 168 hours. No results for input(s): "AMMONIA" in the last 168 hours.  ABG No results found for: "PHART", "PCO2ART", "PO2ART", "HCO3", "TCO2", "ACIDBASEDEF", "O2SAT"   Coagulation Profile: No results for input(s): "INR", "PROTIME" in the last 168 hours.  Cardiac Enzymes: No results for input(s): "CKTOTAL", "CKMB", "CKMBINDEX", "TROPONINI" in the last 168 hours.  HbA1C: Hemoglobin A1C  Date/Time Value Ref Range Status  11/25/2017 10:15 AM 5.9  Final    CBG: No results for input(s): "GLUCAP" in the last 168 hours.  Review of Systems:   Constitutional: negative for anorexia, fevers and sweats  Eyes: negative for irritation, redness and visual disturbance  Ears, nose, mouth, throat, and face: negative for earaches, epistaxis, nasal congestion and sore throat  Respiratory: negative for  dyspnea on exertion, sputum and wheezing  Cardiovascular: negative for chest pain, dyspnea, lower extremity edema, orthopnea,  and syncope  Gastrointestinal: negative for abdominal pain, constipation, diarrhea, melena, nausea and vomiting  Genitourinary:negative for dysuria, frequency and hematuria  Hematologic/lymphatic: negative for bleeding, easy bruising and lymphadenopathy  Musculoskeletal:negative for arthralgias, muscle weakness and stiff joints  Neurological: negative for coordination problems, gait problems, headaches and weakness  Endocrine: negative for diabetic symptoms including polydipsia, polyuria and weight loss   Past Medical History:  She,  has a past medical history of Abnormal glucose tolerance in mother complicating pregnancy (10/19/2011), Ectopic pregnancy, Fibroids, H/O varicella, Headache(784.0), Rh negative status during pregnancy (10/19/2011), and Seasonal allergies.   Surgical History:   Past Surgical History:  Procedure Laterality Date   axillary nodes      2002   CESAREAN SECTION  07/2006   [facility]   CYSTOSCOPY  10/19/2011   Procedure: CYSTOSCOPY;  Surgeon:  Esmeralda Arthur, MD;  Location: [facility];  Service: Gynecology;  Laterality: N/A;   TUBAL LIGATION     WISDOM TOOTH EXTRACTION     as teenager     Social History:   reports that she has never smoked. She has never used smokeless tobacco. She reports that she does not drink alcohol and does not use drugs.   Family History:  Her family history includes Alcohol abuse in her brother and father; Arthritis in her mother; Cancer in her mother; Heart disease in her father; Hypertension in her father and mother.   Allergies Allergies  Allergen Reactions   Penicillins Hives, Nausea And Vomiting and Nausea Only     Home Medications  Prior to Admission medications   Medication Sig Start Date End Date Taking? Authorizing Provider  acetaminophen (TYLENOL) 500 MG tablet Take 1,000 mg by mouth every 6 (six) hours as needed for mild pain (pain score 1-3), moderate pain (pain score 4-6) or fever.   Yes [provider]  cetirizine (ZYRTEC) 10 MG tablet Take 1 tablet (10 mg total) by mouth daily. 02/03/21  Yes M[place], Rolly Salter E, FNP  Cyanocobalamin (VITAMIN B-12 PO) Take 1 tablet by mouth daily.   Yes [provider]  fluticasone (FLONASE) 50 MCG/ACT nasal spray Place 1 spray into both nostrils daily. Patient taking differently: Place 1 spray into both nostrils as needed for allergies. 06/12/22  Yes Ellsworth Lennox, PA-C  Multiple Vitamin (MULTIVITAMIN) tablet Take 1 tablet by mouth daily.   Yes [provider]  VITAMIN D, CHOLECALCIFEROL, PO Take 1 tablet by mouth daily.   Yes [provider]  azithromycin (ZITHROMAX Z-PAK) 250 MG tablet 2 tablets initially and then 1 tablet daily until completed. Patient not taking: Reported on 07/13/2023 06/12/22   Ellsworth Lennox, PA-C  diphenhydrAMINE (BENADRYL) 25 MG tablet Take 25 mg by mouth every 6 (six) hours as needed.  11/26/19  [provider]       Cyril Mourning MD. FCCP. [facility] Pager : [phone]  If no response to pager , please call [phone] until 7 pm After 7:00 pm call Elink  [phone]   07/15/2023

## 2023-07-16 ENCOUNTER — Encounter (HOSPITAL_COMMUNITY): Payer: Self-pay | Admitting: Pulmonary Disease

## 2023-07-19 LAB — CULTURE, BAL-QUANTITATIVE W GRAM STAIN
Culture: NO GROWTH
Gram Stain: NONE SEEN

## 2023-07-19 LAB — ACID FAST SMEAR (AFB, MYCOBACTERIA): Acid Fast Smear: NEGATIVE

## 2023-07-19 LAB — ANAEROBIC CULTURE W GRAM STAIN: Gram Stain: NONE SEEN

## 2023-07-21 ENCOUNTER — Encounter: Payer: Self-pay | Admitting: Pulmonary Disease

## 2023-07-21 LAB — CYTOLOGY - NON PAP

## 2023-07-21 NOTE — Progress Notes (Signed)
 Pt.notified

## 2023-07-28 NOTE — Progress Notes (Signed)
 Cardiology Office Note:  .   Date:  08/03/2023  ID:  Brenda Valencia, DOB [birth year], MRN [id number] PCP: Pcp, No  [facility] Providers Cardiologist:  Maisie Fus, MD    History of Present Illness: .   Brenda Valencia is a 53 y.o. 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 She is followed by Dr. Wyline Mood and presents today for a hospital follow up appointment.   Patient was recently admitted from 2/25-2/27/25 with tachycardia. She presented to the ED complaining of a fast heart beat. EKG showed sinus tachycardia. TSH within normal limits. hsTn 118>185. Echocardiogram showed EF 70-75%, no regional wall motion abnormalities, grade I DD, normal RV function, no significant valvular abnormalities. Cardiology was consulted. Her BP was elevated and she was started on carvedilol 25 mg BID, amlodipine 10 mg daily.   CTA chest showed no PE. Did show multiple solid noncalcified pulmonary nodules, predominantly in peribronchovascular location and mainly in the upper lobes. There is associated mediastinal and hilar lymphadenopathy. Pulmonology was consulted and performed video bronchoscopy. Results followed by pulmonology, so far have been negative for infection or malignancy. NO typical granulomas of sarcoidosis were seen, but that is still in their differential.   Today, patient presents for a hospital follow-up appointment. She reports feeling tired since her discharge, which she attributes to a lack of sleep. Fatigue has been improving over time. The patient's heart rate has been well-controlled with carvedilol, and she has not experienced any significant side effects. She has noticed a single episode of heart fluttering while moving in bed, but it resolved quickly. The patient denies any chest pain or breathing difficulties. She has been monitoring her blood pressure at home, which has been mostly within the normal range, 120s-130s/70s-80s. The patient is also  scheduled to see a pulmonologist for further evaluation of her lung issues.  ROS: Denies chest pain, shortness of breath, ankle swelling, palpitations, syncope, near syncope, dizziness.   Studies Reviewed: .   Cardiac Studies & Procedures   ______________________________________________________________________________________________     ECHOCARDIOGRAM  ECHOCARDIOGRAM COMPLETE 07/13/2023  Narrative ECHOCARDIOGRAM REPORT    Patient Name:   Brenda Valencia Date of Exam: 07/13/2023 Medical Rec #:  [id number]            Height:       66.0 in Accession #:    [id number]           Weight:       220.0 lb Date of Birth:  [birth year]            BSA:          2.082 m Patient Age:    52 years             BP:           161/96 mmHg Patient Gender: F                    HR:           93 bpm. Exam Location:  Inpatient  Procedure: 2D Echo, Cardiac Doppler and Color Doppler (Both Spectral and Color Flow Doppler were utilized during procedure).  Indications:    Cardiomyopathy-unspecified  History:        Patient has no prior history of Echocardiogram examinations. Arrythmias:Tachycardia; Signs/Symptoms:Fatigue.  Sonographer:    Vern Claude Referring Phys: [phone] ANTHONY ALLEN  IMPRESSIONS   1. Left ventricular ejection fraction, by estimation, is 70 to 75%. The  left ventricle has hyperdynamic function. The left ventricle has no regional wall motion abnormalities. Left ventricular diastolic parameters are consistent with Grade I diastolic dysfunction (impaired relaxation). 2. Right ventricular systolic function is normal. The right ventricular size is normal. 3. The mitral valve is normal in structure. Trivial mitral valve regurgitation. No evidence of mitral stenosis. 4. The aortic valve is normal in structure. Aortic valve regurgitation is trivial. No aortic stenosis is present. 5. The inferior vena cava is normal in size with greater than 50% respiratory variability, suggesting right  atrial pressure of 3 mmHg.  FINDINGS Left Ventricle: Left ventricular ejection fraction, by estimation, is 70 to 75%. The left ventricle has hyperdynamic function. The left ventricle has no regional wall motion abnormalities. Strain imaging was not performed. The left ventricular internal cavity size was normal in size. There is no left ventricular hypertrophy. Left ventricular diastolic parameters are consistent with Grade I diastolic dysfunction (impaired relaxation).  Right Ventricle: The right ventricular size is normal. No increase in right ventricular wall thickness. Right ventricular systolic function is normal.  Left Atrium: Left atrial size was normal in size.  Right Atrium: Right atrial size was normal in size.  Pericardium: There is no evidence of pericardial effusion.  Mitral Valve: The mitral valve is normal in structure. Trivial mitral valve regurgitation. No evidence of mitral valve stenosis. MV peak gradient, 4.2 mmHg. The mean mitral valve gradient is 2.0 mmHg.  Tricuspid Valve: The tricuspid valve is normal in structure. Tricuspid valve regurgitation is trivial. No evidence of tricuspid stenosis.  Aortic Valve: The aortic valve is normal in structure. Aortic valve regurgitation is trivial. No aortic stenosis is present. Aortic valve mean gradient measures 3.0 mmHg. Aortic valve peak gradient measures 5.1 mmHg. Aortic valve area, by VTI measures 3.18 cm.  Pulmonic Valve: The pulmonic valve was normal in structure. Pulmonic valve regurgitation is trivial. No evidence of pulmonic stenosis.  Aorta: The aortic root is normal in size and structure.  Venous: The inferior vena cava is normal in size with greater than 50% respiratory variability, suggesting right atrial pressure of 3 mmHg.  IAS/Shunts: No atrial level shunt detected by color flow Doppler.  Additional Comments: 3D imaging was not performed.   LEFT VENTRICLE PLAX 2D LVIDd:         3.80 cm       Diastology LVIDs:         2.80 cm      LV e' medial:    18.30 cm/s LV PW:         0.80 cm      LV E/e' medial:  3.3 LV IVS:        1.10 cm      LV e' lateral:   9.79 cm/s LVOT diam:     2.00 cm      LV E/e' lateral: 6.1 LV SV:         53 LV SV Index:   25 LVOT Area:     3.14 cm  LV Volumes (MOD) LV vol d, MOD A2C: 106.0 ml LV vol d, MOD A4C: 91.5 ml LV vol s, MOD A2C: 32.3 ml LV vol s, MOD A4C: 31.0 ml LV SV MOD A2C:     73.7 ml LV SV MOD A4C:     91.5 ml LV SV MOD BP:      73.0 ml  RIGHT VENTRICLE             IVC RV Basal diam:  3.10 cm  IVC diam: 1.20 cm RV Mid diam:    2.40 cm RV S prime:     21.40 cm/s  LEFT ATRIUM             Index        RIGHT ATRIUM           Index LA diam:        2.90 cm 1.39 cm/m   RA Area:     11.30 cm LA Vol (A2C):   20.0 ml 9.60 ml/m   RA Volume:   26.10 ml  12.53 ml/m LA Vol (A4C):   23.1 ml 11.09 ml/m LA Biplane Vol: 22.2 ml 10.66 ml/m AORTIC VALVE                    PULMONIC VALVE AV Area (Vmax):    2.59 cm     PV Vmax:       1.01 m/s AV Area (Vmean):   2.79 cm     PV Peak grad:  4.0 mmHg AV Area (VTI):     3.18 cm AV Vmax:           113.00 cm/s AV Vmean:          76.400 cm/s AV VTI:            0.167 m AV Peak Grad:      5.1 mmHg AV Mean Grad:      3.0 mmHg LVOT Vmax:         93.00 cm/s LVOT Vmean:        67.800 cm/s LVOT VTI:          0.169 m LVOT/AV VTI ratio: 1.01  AORTA Ao Root diam: 3.00 cm Ao Asc diam:  3.20 cm  MITRAL VALVE MV Area (PHT): 4.60 cm     SHUNTS MV Area VTI:   3.47 cm     Systemic VTI:  0.17 m MV Peak grad:  4.2 mmHg     Systemic Diam: 2.00 cm MV Mean grad:  2.0 mmHg MV Vmax:       1.02 m/s MV Vmean:      71.7 cm/s MV Decel Time: 165 msec MV E velocity: 60.00 cm/s MV A velocity: 102.00 cm/s MV E/A ratio:  0.59  Arvilla Meres MD Electronically signed by Arvilla Meres MD Signature Date/Time: 07/13/2023/4:27:42 PM    Final           ______________________________________________________________________________________________      Risk Assessment/Calculations:             Physical Exam:   VS:  BP 134/86   Pulse 74   Ht 5\' 6"  (1.676 m)   Wt 227 lb 3.2 oz (103.1 kg)   SpO2 98%   BMI 36.67 kg/m    Wt Readings from Last 3 Encounters:  08/03/23 227 lb 3.2 oz (103.1 kg)  07/14/23 220 lb 10.9 oz (100.1 kg)  11/27/22 220 lb (99.8 kg)    GEN: Well nourished, well developed in no acute distress. Sitting upright on the exam table  NECK: No JVD CARDIAC: RRR, no murmurs, rubs, gallops. Radial pulses 2+ bilaterally  RESPIRATORY:  Clear to auscultation without rales, wheezing or rhonchi. Normal work of breathing on room air   ABDOMEN: Soft, non-tender, non-distended EXTREMITIES:  No edema in BLE; No deformity   ASSESSMENT AND PLAN: .    Sinus Tachycardia  - Patient presented to the ED on 2/25 with elevated HR. EKG showed sinus tachycardia with HR 118  BPM. hsTn 118>>185. TSH normal. Hemoglobin 14.4 - CT chest was without PE, did show multiple nodules and mediastinal and hilar lymphadenopathy  - Echo from 2/25 showed EF 70-75%, no regional wall motion abnormalities, grade I DD, normal RV function, no significant valvular abnormalities. With lack of chest pain and normal echo, suspected trop elevation was demand ischemia  - Possible that tachycardia was related to underlying pulmonary condition with abnormal CT findings  - Patient denies recurrence of fast heart rates at home. She checks her BP and HR 2x per day, review of log shows that her HR has been in the 70s-80s. No chest pain, shortness of breath  - Continue carvedilol 25 mg BID   Abnormal Chest CT  - CTA chest showed no PE. Did show multiple solid noncalcified pulmonary nodules, predominantly in peribronchovascular location and mainly in the upper lobes. There is associated mediastinal and hilar lymphadenopathy - Pulm followed her during her recent admission.  They performed video bronchoscopy  - So far, results have been negative for infection or malignancy. No typical granulomas of sarcoidosis were seen, but sarcoid is still on their differential  - She has follow up with pulmonology next week. Denies any shortness of breath   HTN  - BP well controlled. Denies symptoms of orthostatic hypotension  - Continue amlodipine 10 mg daily, carvedilol 25 mg BID  - Patient has been working on following a healthy diet and increasing physical activity. Encouraged these changes    Dispo: Follow up in 5 months with Me/APP   Signed, Jonita Albee, PA-C

## 2023-08-03 ENCOUNTER — Encounter: Payer: Self-pay | Admitting: Cardiology

## 2023-08-03 ENCOUNTER — Ambulatory Visit: Payer: BC Managed Care – PPO | Attending: Cardiology | Admitting: Cardiology

## 2023-08-03 VITALS — BP 134/86 | HR 74 | Ht 66.0 in | Wt 227.2 lb

## 2023-08-03 DIAGNOSIS — R000 Tachycardia, unspecified: Secondary | ICD-10-CM | POA: Diagnosis not present

## 2023-08-03 DIAGNOSIS — I10 Essential (primary) hypertension: Secondary | ICD-10-CM

## 2023-08-03 DIAGNOSIS — R9389 Abnormal findings on diagnostic imaging of other specified body structures: Secondary | ICD-10-CM | POA: Diagnosis not present

## 2023-08-03 NOTE — Patient Instructions (Signed)
 Medication Instructions:  No changes *If you need a refill on your cardiac medications before your next appointment, please call your pharmacy*  Lab Work: No labs  Testing/Procedures: No testing  Follow-Up: At [facility], you and your health needs are our priority.  As part of our continuing mission to provide you with exceptional heart care, we have created designated Provider Care Teams.  These Care Teams include your primary Cardiologist (physician) and Advanced Practice Providers (APPs -  Physician Assistants and Nurse Practitioners) who all work together to provide you with the care you need, when you need it.  We recommend signing up for the patient portal called "MyChart".  Sign up information is provided on this After Visit Summary.  MyChart is used to connect with patients for Virtual Visits (Telemedicine).  Patients are able to view lab/test results, encounter notes, upcoming appointments, etc.  Non-urgent messages can be sent to your provider as well.   To learn more about what you can do with MyChart, go to [email or web address].    Your next appointment:   4-5 month(s)  Provider:   Robet Leu, PA-C    Other Instructions

## 2023-08-17 LAB — FUNGUS CULTURE RESULT

## 2023-08-17 LAB — FUNGAL ORGANISM REFLEX

## 2023-08-17 LAB — FUNGUS CULTURE WITH STAIN

## 2023-08-19 ENCOUNTER — Encounter: Payer: Self-pay | Admitting: Primary Care

## 2023-08-19 ENCOUNTER — Ambulatory Visit: Payer: BC Managed Care – PPO | Admitting: Primary Care

## 2023-08-19 ENCOUNTER — Other Ambulatory Visit (INDEPENDENT_AMBULATORY_CARE_PROVIDER_SITE_OTHER)

## 2023-08-19 VITALS — BP 123/86 | HR 80 | Temp 97.1°F | Ht 66.0 in | Wt 223.6 lb

## 2023-08-19 DIAGNOSIS — E559 Vitamin D deficiency, unspecified: Secondary | ICD-10-CM | POA: Diagnosis not present

## 2023-08-19 DIAGNOSIS — R059 Cough, unspecified: Secondary | ICD-10-CM | POA: Diagnosis not present

## 2023-08-19 DIAGNOSIS — R590 Localized enlarged lymph nodes: Secondary | ICD-10-CM | POA: Diagnosis not present

## 2023-08-19 LAB — VITAMIN D 25 HYDROXY (VIT D DEFICIENCY, FRACTURES): VITD: 39.69 ng/mL (ref 30.00–100.00)

## 2023-08-19 NOTE — Patient Instructions (Addendum)
 -  POSSIBLE SARCOIDOSIS: Sarcoidosis is a condition where small clusters of inflammatory cells grow in different parts of your body, often the lungs. Although your tests did not show clear signs of this condition, we will check your ACE level to help rule it out. We will consult with Dr. Cathi Roan about the results and decide if you need any further treatment, such as prednisone. A follow-up CT scan is planned in 6 months unless Dr. Vassie Loll advises otherwise.  -INTERMITTENT DRY COUGH: Your intermittent dry cough is likely due to seasonal allergies. Since your lung function tests were normal, we do not suspect asthma. Continue using your allergy medications as needed.  -HYPERTENSION: Hypertension, or high blood pressure, is being managed with your current medications, Coreg and Amlodipine. Continue taking these medications as prescribed.  -VITAMIN D SUPPLEMENTATION: Your previous vitamin D level borderline low, and your recent calcium levels are normal. To avoid the risk of high calcium levels, you should continue with your regular multivitamin. We will check Vit D level   INSTRUCTIONS:  1. Get your ACE level checked to assess for active sarcoidosis and vit D level ([facility] lab) 2. We will consult with Dr. Vassie Loll regarding your ACE level results and any potential need for prednisone. 3. Plan for a follow-up CT scan in 6 months unless advised otherwise by Dr. Vassie Loll 4. Continue taking your current medications for hypertension as prescribed. 5. Use your allergy medications as needed for your intermittent dry cough. 6. Continue with your regular multivitamin and avoid additional vitamin D supplements.

## 2023-08-19 NOTE — Progress Notes (Signed)
 @Patient  ID: Brenda Valencia, female    DOB: [birth year], 53 y.o.   MRN: [id number]  Chief Complaint  Patient presents with   Follow-up    Hospital F/u    Referring provider: No ref. provider found  HPI: 53 year old female, never smoked. PMH significant for  mediastinal lymphadenopathy, pulmonary nodules, tachycardia, obesity,   08/19/2023 Discussed the use of AI scribe software for clinical note transcription with the patient, who gave verbal consent to proceed.  History of Present Illness   Brenda Valencia is a 53 year old female who presents for a hospital follow-up after an episode of tachycardia and elevated blood pressure.  She was hospitalized from February 25th to February 27th due to tachycardia and elevated blood pressure. During her hospital stay, labs showed an elevated troponin level, but her thyroid level and respiratory panel were normal. A CT angiogram of the chest was negative for a blood clot but revealed lung nodules and hilar or distal lymphadenopathy. A bronchoscopy was performed, and pathology results were not consistent with malignancy or infection, and no granulomas were found.  Since discharge, the respiratory infection has cleared up, but she experiences an intermittent dry cough that flares up with seasonal allergies. No current chest pain or trouble breathing.   Her current medications include Coreg 5 mg twice daily, amlodipine 10 mg daily, cetirizine as needed, mucus relief DM as needed, and Flonase nasal spray as needed. She also takes a multivitamin and has previously taken vitamin D supplements, but has not taken any for the past month. Recent lab work showed normal potassium and calcium levels.     Allergies  Allergen Reactions   Penicillins Hives, Nausea And Vomiting and Nausea Only    Immunization History  Administered Date(s) Administered   Influenza Split 06/01/2011   Influenza-Unspecified 03/30/2018   PFIZER(Purple Top)SARS-COV-2  Vaccination 08/04/2019, 08/29/2019   Rho (D) Immune Globulin 03/19/2011, 08/20/2011, 10/20/2011   Tdap 10/20/2011    Past Medical History:  Diagnosis Date   Abnormal glucose tolerance in mother complicating pregnancy 10/19/2011   Elevated early 1hr gtt; normal 3hr gtt x2   Ectopic pregnancy    Treated with MTX   Fibroids    H/O varicella    Headache(784.0)    otc med prn   Rh negative status during pregnancy 10/19/2011   Seasonal allergies     Tobacco History: Social History   Tobacco Use  Smoking Status Never  Smokeless Tobacco Never   Counseling given: Not Answered   Outpatient Medications Prior to Visit  Medication Sig Dispense Refill   acetaminophen (TYLENOL) 500 MG tablet Take 1,000 mg by mouth every 6 (six) hours as needed for mild pain (pain score 1-3), moderate pain (pain score 4-6) or fever.     amLODipine (NORVASC) 10 MG tablet Take 1 tablet (10 mg total) by mouth daily. 60 tablet 0   Blood Pressure Monitoring (OMRON 3 SERIES BP MONITOR) DEVI Use to check blood pressure 1 each 0   carvedilol (COREG) 25 MG tablet Take 1 tablet (25 mg total) by mouth 2 (two) times daily with a meal. 120 tablet 0   cetirizine (ZYRTEC) 10 MG tablet Take 1 tablet (10 mg total) by mouth daily. 30 tablet 0   Cyanocobalamin (VITAMIN B-12 PO) Take 1 tablet by mouth daily.     dextromethorphan-guaiFENesin (MUCINEX DM) 30-600 MG 12hr tablet Take 1 tablet by mouth 2 (two) times daily as needed for cough. 20 tablet 0   fluticasone (FLONASE)  50 MCG/ACT nasal spray Place 1 spray into both nostrils daily. (Patient taking differently: Place 1 spray into both nostrils as needed for allergies.) 16 g 0   Multiple Vitamin (MULTIVITAMIN) tablet Take 1 tablet by mouth daily.     VITAMIN D, CHOLECALCIFEROL, PO Take 1 tablet by mouth daily. (Patient not taking: Reported on 08/19/2023)     No facility-administered medications prior to visit.   Review of Systems  Review of Systems  Constitutional: Negative.    HENT: Negative.    Respiratory:  Positive for cough. Negative for chest tightness, shortness of breath and wheezing.   Cardiovascular:  Negative for chest pain.   Physical Exam  BP 123/86 (BP Location: Right Arm, Patient Position: Sitting, Cuff Size: Large)   Pulse 80   Temp (!) 97.1 F (36.2 C) (Temporal)   Ht 5\' 6"  (1.676 m)   Wt 223 lb 9.6 oz (101.4 kg)   SpO2 98%   BMI 36.09 kg/m  Physical Exam Constitutional:      General: She is not in acute distress.    Appearance: Normal appearance. She is not ill-appearing.  Cardiovascular:     Rate and Rhythm: Normal rate and regular rhythm.  Pulmonary:     Effort: Pulmonary effort is normal.     Breath sounds: Normal breath sounds.  Neurological:     General: No focal deficit present.     Mental Status: She is alert and oriented to person, place, and time. Mental status is at baseline.  Psychiatric:        Mood and Affect: Mood normal.        Behavior: Behavior normal.        Thought Content: Thought content normal.        Judgment: Judgment normal.      Lab Results:  CBC    Component Value Date/Time   WBC 5.5 07/13/2023 0827   RBC 5.13 (H) 07/13/2023 0827   HGB 14.4 07/13/2023 0827   HCT 45.1 07/13/2023 0827   PLT 285 07/13/2023 0827   MCV 87.9 07/13/2023 0827   MCH 28.1 07/13/2023 0827   MCHC 31.9 07/13/2023 0827   RDW 13.3 07/13/2023 0827   LYMPHSABS 1.2 07/13/2023 0827   MONOABS 0.7 07/13/2023 0827   EOSABS 0.1 07/13/2023 0827   BASOSABS 0.0 07/13/2023 0827    BMET    Component Value Date/Time   NA 138 07/13/2023 0827   NA 139 11/25/2017 1015   K 3.7 07/13/2023 0827   CL 105 07/13/2023 0827   CO2 24 07/13/2023 0827   GLUCOSE 160 (H) 07/13/2023 0827   GLUCOSE 69 (L) 08/28/2011 0918   BUN 9 07/13/2023 0827   BUN 8 11/25/2017 1015   CREATININE 0.73 07/13/2023 0827   CALCIUM 9.6 07/13/2023 0827   GFRNONAA >60 07/13/2023 0827    BNP No results found for: "BNP"  ProBNP No results found for:  "PROBNP"  Imaging: No results found.   Assessment & Plan:   1. Mediastinal lymphadenopathy (Primary) - Angiotensin converting enzyme  2. Cough, unspecified type - Angiotensin converting enzyme  3. Vitamin D deficiency - Vitamin D (25 hydroxy); Future   Assessment and Plan    Possible Sarcoidosis Hospitalized for tachycardia and hypertension post-upper respiratory illness. Imaging showed lung nodules and lymphadenopathy. Bronchoscopy negative for malignancy or infection. Sarcoidosis remains a differential diagnosis despite absence of granulomas.  - Order ACE level to assess for active sarcoidosis. - Consult with Dr. Vassie Loll regarding ACE level results and potential need  for prednisone. - Plan follow-up CT scan in 6 months unless advised earlier by Dr. Vassie Loll  Intermittent Dry Cough Intermittent dry cough associated with seasonal allergies.   Hypertension Managed with Coreg 5 mg twice daily and Amlodipine 10 mg daily.  Vitamin D Supplementation Inquired about vitamin D supplementation. Previous level was 27, not low. Recent labs showed normal calcium. Advised against high-dose vitamin D due to hypercalcemia risk. - Check vitamin D and calcium levels with basic metabolic panel. - Advise continuation of regular multivitamin without additional vitamin D supplementation.        Glenford Bayley, NP 08/19/2023

## 2023-08-30 LAB — ACID FAST CULTURE WITH REFLEXED SENSITIVITIES (MYCOBACTERIA): Acid Fast Culture: NEGATIVE

## 2023-09-15 ENCOUNTER — Telehealth: Payer: Self-pay | Admitting: Primary Care

## 2023-09-15 DIAGNOSIS — R590 Localized enlarged lymph nodes: Secondary | ICD-10-CM

## 2023-09-15 DIAGNOSIS — R059 Cough, unspecified: Secondary | ICD-10-CM

## 2023-09-15 NOTE — Telephone Encounter (Signed)
-----   Message from [place] V. Alva sent at 08/19/2023  1:18 PM EDT ----- Regarding: RE: Sarcoid Radiologically, very consistent with sarcoidosis. She was asymptomatic. Will obtain baseline PFTs, ACE level and repeat CT chest with contrast in 3 to 6 months ----- Message ----- From: Antonio Baumgarten, NP Sent: 08/19/2023  11:44 AM EDT To: Lind Repine, MD Subject: Sarcoid                                        Cytology was negative for infection or malignancy. No typical granulomas of sarcoidosis were seen but you felt that was most likely diagnosis  Anerobic culture 2/27- positive for STREPTOCOCCUS PARASANGUINIS  She has a very sporadic dry cough. No mucus production. No chest pain or shortness of breath Do you want to get repeat CT chest in 3-6 months I'll check an ACE level   -[employer]

## 2023-09-15 NOTE — Telephone Encounter (Signed)
 I spoke with Dr. Villa Greaser.  Imaging consistent with sarcoid. I have ordered repeat CT chest in 6 months. ACE level needs to be collected. Needs PFTs first available    Please call patient and ask she have labs completed

## 2023-09-15 NOTE — Telephone Encounter (Signed)
 ATC X1. LMTCB

## 2023-09-15 NOTE — Telephone Encounter (Signed)
 Patient is returning a call from Ashlyn and called cal and they said she is at  lunch and will return call once coming back from lunch

## 2023-09-16 NOTE — Telephone Encounter (Signed)
 ATC X2. LMTCB. Sending Mychart message then completing note per protocol.

## 2023-10-08 ENCOUNTER — Telehealth: Payer: Self-pay | Admitting: Cardiology

## 2023-10-08 MED ORDER — AMLODIPINE BESYLATE 10 MG PO TABS: 10.0000 mg | ORAL_TABLET | Freq: Every day | ORAL | 3 refills | Status: AC

## 2023-10-08 MED ORDER — CARVEDILOL 25 MG PO TABS: 25.0000 mg | ORAL_TABLET | Freq: Two times a day (BID) | ORAL | 3 refills | Status: AC

## 2023-10-08 NOTE — Telephone Encounter (Signed)
*  STAT* If patient is at the pharmacy, call can be transferred to refill team.   1. Which medications need to be refilled? (please list name of each medication and dose if known)  amLODipine  (NORVASC ) 10 MG tablet carvedilol  (COREG ) 25 MG tablet   2. Which pharmacy/location (including street and city if local pharmacy) is medication to be sent to? [id number] - [place], [place] - [address] AT [facility] [address]   3. Do they need a 30 day or 90 day supply?  90 day supply

## 2023-10-08 NOTE — Telephone Encounter (Signed)
 Pt's medications were sent to pt's pharmacy as requested. Confirmation received.

## 2023-11-27 ENCOUNTER — Encounter: Payer: Self-pay | Admitting: *Deleted

## 2023-11-27 ENCOUNTER — Ambulatory Visit
Admission: EM | Admit: 2023-11-27 | Discharge: 2023-11-27 | Disposition: A | Discharge status: Home / Self Care | Attending: Physician Assistant | Admitting: Physician Assistant

## 2023-11-27 ENCOUNTER — Other Ambulatory Visit: Payer: Self-pay

## 2023-11-27 DIAGNOSIS — R051 Acute cough: Secondary | ICD-10-CM | POA: Diagnosis not present

## 2023-11-27 DIAGNOSIS — J019 Acute sinusitis, unspecified: Secondary | ICD-10-CM

## 2023-11-27 HISTORY — DX: Sarcoidosis, unspecified: D86.9

## 2023-11-27 HISTORY — DX: Essential (primary) hypertension: I10

## 2023-11-27 MED ORDER — DOXYCYCLINE HYCLATE 100 MG PO CAPS: 100.0000 mg | ORAL_CAPSULE | Freq: Two times a day (BID) | ORAL | 0 refills | Status: AC

## 2023-11-27 MED ORDER — ALBUTEROL SULFATE HFA 108 (90 BASE) MCG/ACT IN AERS: 1.0000 | INHALATION_SPRAY | Freq: Four times a day (QID) | RESPIRATORY_TRACT | 0 refills | Status: AC | PRN

## 2023-11-27 NOTE — ED Triage Notes (Signed)
 Headache, bilateral eye pain and pressure with mucous, sinus pressure, cough since Monday. Voices concern for sinus infection. No fever. Had neg home covid/flu test yesterday. She is taking tylenol , flonase   and zyrtec  with minimal relief

## 2023-11-27 NOTE — Discharge Instructions (Signed)
 Take antibiotic as prescribed. Recommend Flonase  and Mucinex  for congestion. Can take Delsym  or Robitussin cough syrup which is over-the-counter for cough as needed. Use inhaler as needed for shortness of breath or wheezing. If symptoms become worse or you develop fevers please return for evaluation.

## 2023-11-27 NOTE — ED Provider Notes (Signed)
 [name]    CSN: [id number] Arrival date & time: 11/27/23  0803      History   Chief Complaint Chief Complaint  Patient presents with   Eye Pain   Headache    HPI Brenda Valencia is a 53 y.o. female.   Patient presents with 7 days of headaches, bilateral eye pressure, sinus pressure, cough.  Reports productive cough.  Denies shortness of breath or wheezing.  Denies fever, chills, nausea, vomiting.  She has taken a home COVID/flu test which was negative.  She has been taking Tylenol , Flonase  and Zyrtec  with minimal relief.  She reports she is using inhaler in the past, following with pulmonologist for recent diagnosis of sarcoidosis.  But reports she has not needed the inhaler and does not have a refill of this at this time.    Past Medical History:  Diagnosis Date   Abnormal glucose tolerance in mother complicating pregnancy 10/19/2011   Elevated early 1hr gtt; normal 3hr gtt x2   Ectopic pregnancy    Treated with MTX   Fibroids    H/O varicella    Headache(784.0)    otc med prn   Hypertension    Rh negative status during pregnancy 10/19/2011   Sarcoidosis    Seasonal allergies     Patient Active Problem List   Diagnosis Date Noted   Mediastinal lymphadenopathy 07/15/2023   Tachycardia 07/13/2023   History of cesarean section 11/27/2022   Persistent cough 07/14/2018   Vitamin D  deficiency 02/24/2018   Other muscle spasm 02/24/2018   Awareness of heart beat 02/24/2018   Fatigue 02/24/2018   Abnormal glucose 02/24/2018   Menorrhagia 05/18/2014   Rh negative status during pregnancy 10/19/2011   Obesity 09/29/2011   Encounter for sterilization 09/29/2011   Breech presentation 09/29/2011   AMA (advanced maternal age) multigravida 35+ 03/19/2011   Penicillin allergy 03/19/2011   History of ectopic pregnancy 03/19/2011   Uterine leiomyoma 03/19/2011   Spotting in early pregnancy 02/23/2011    Past Surgical History:  Procedure Laterality  Date   axillary nodes      2002   BRONCHIAL NEEDLE ASPIRATION BIOPSY  07/15/2023   Procedure: BRONCHIAL NEEDLE ASPIRATION BIOPSIES;  Surgeon: Jude Harden GAILS, MD;  Location: [facility];  Service: Cardiopulmonary;;   BRONCHIAL WASHINGS  07/15/2023   Procedure: BRONCHIAL WASHINGS;  Surgeon: Jude Harden GAILS, MD;  Location: [facility];  Service: Cardiopulmonary;;   CESAREAN SECTION  07/2006   NR[facility]   CYSTOSCOPY  10/19/2011   Procedure: PHYLLIS;  Surgeon: Nena DELENA App, MD;  Location: [facility];  Service: Gynecology;  Laterality: N/A;   ENDOBRONCHIAL ULTRASOUND Bilateral 07/15/2023   Procedure: ENDOBRONCHIAL ULTRASOUND;  Surgeon: Jude Harden GAILS, MD;  Location: [facility];  Service: Cardiopulmonary;  Laterality: Bilateral;   TUBAL LIGATION     VIDEO BRONCHOSCOPY  07/15/2023   Procedure: VIDEO BRONCHOSCOPY;  Surgeon: Jude Harden GAILS, MD;  Location: [facility];  Service: Cardiopulmonary;;   WISDOM TOOTH EXTRACTION     as teenager    OB History     Gravida  3   Para  2   Term  2   Preterm      AB  1   Living  2      SAB      IAB      Ectopic  1   Multiple      Live Births  2            Home Medications  Prior to Admission medications   Medication Sig Start Date End Date Taking? Authorizing Provider  acetaminophen  (TYLENOL ) 500 MG tablet Take 1,000 mg by mouth every 6 (six) hours as needed for mild pain (pain score 1-3), moderate pain (pain score 4-6) or fever.   Yes [provider]  albuterol  (VENTOLIN  HFA) 108 (90 Base) MCG/ACT inhaler Inhale 1-2 puffs into the lungs every 6 (six) hours as needed for wheezing or shortness of breath. 11/27/23  Yes Ward, Harlene PEDLAR, PA-C  amLODipine  (NORVASC ) 10 MG tablet Take 1 tablet (10 mg total) by mouth daily. 10/08/23  Yes Vicci Rollo SAUNDERS, PA-C  carvedilol  (COREG ) 25 MG tablet Take 1 tablet (25 mg total) by mouth 2 (two) times daily with a meal. 10/08/23  Yes Vicci Rollo SAUNDERS, PA-C  cetirizine  (ZYRTEC ) 10 MG  tablet Take 1 tablet (10 mg total) by mouth daily. 02/03/21  Yes Mound, Darryle E, FNP  doxycycline  (VIBRAMYCIN ) 100 MG capsule Take 1 capsule (100 mg total) by mouth 2 (two) times daily for 7 days. 11/27/23 12/04/23 Yes Ward, Harlene PEDLAR, PA-C  fluticasone  (FLONASE ) 50 MCG/ACT nasal spray Place 1 spray into both nostrils daily. Patient taking differently: Place 1 spray into both nostrils as needed for allergies. 06/12/22  Yes Lynwood Lenis, PA-C  Multiple Vitamin (MULTIVITAMIN) tablet Take 1 tablet by mouth daily.   Yes [provider]  Blood Pressure Monitoring (OMRON 3 SERIES BP MONITOR) DEVI Use to check blood pressure 07/15/23   Jillian Buttery, MD  Cyanocobalamin (VITAMIN B-12 PO) Take 1 tablet by mouth daily. Patient not taking: Reported on 11/27/2023    [provider]  dextromethorphan -guaiFENesin  (MUCINEX  DM) 30-600 MG 12hr tablet Take 1 tablet by mouth 2 (two) times daily as needed for cough. Patient not taking: Reported on 11/27/2023 07/15/23   Jillian Buttery, MD  diphenhydrAMINE  (BENADRYL ) 25 MG tablet Take 25 mg by mouth every 6 (six) hours as needed.  11/26/19  [provider]    Family History Family History  Problem Relation Age of Onset   Hypertension Mother    Arthritis Mother    Cancer Mother    Heart disease Father    Hypertension Father    Alcohol abuse Father    Alcohol abuse Brother     Social History Social History   Tobacco Use   Smoking status: Never   Smokeless tobacco: Never  Vaping Use   Vaping status: Never Used  Substance Use Topics   Alcohol use: No   Drug use: No     Allergies   Penicillins   Review of Systems Review of Systems  Constitutional:  Negative for chills and fever.  HENT:  Positive for congestion and sinus pressure. Negative for ear pain and sore throat.   Eyes:  Negative for pain and visual disturbance.  Respiratory:  Positive for cough. Negative for shortness of breath.   Cardiovascular:  Negative for chest  pain and palpitations.  Gastrointestinal:  Negative for abdominal pain and vomiting.  Genitourinary:  Negative for dysuria and hematuria.  Musculoskeletal:  Negative for arthralgias and back pain.  Skin:  Negative for color change and rash.  Neurological:  Negative for seizures and syncope.  All other systems reviewed and are negative.    Physical Exam Triage Vital Signs ED Triage Vitals  Encounter Vitals Group     BP 11/27/23 0827 122/81     Girls Systolic BP Percentile --      Girls Diastolic BP Percentile --  Boys Systolic BP Percentile --      Boys Diastolic BP Percentile --      Pulse Rate 11/27/23 0827 92     Resp 11/27/23 0827 18     Temp 11/27/23 0827 98.5 F (36.9 C)     Temp Source 11/27/23 0827 Oral     SpO2 11/27/23 0827 97 %     Weight --      Height --      Head Circumference --      Peak Flow --      Pain Score 11/27/23 0822 4     Pain Loc --      Pain Education --      Exclude from Growth Chart --    No data found.  Updated Vital Signs BP 122/81 (BP Location: Left Arm)   Pulse 92   Temp 98.5 F (36.9 C) (Oral)   Resp 18   SpO2 97%   Visual Acuity Right Eye Distance:   Left Eye Distance:   Bilateral Distance:    Right Eye Near:   Left Eye Near:    Bilateral Near:     Physical Exam Vitals and nursing note reviewed.  Constitutional:      General: She is not in acute distress.    Appearance: She is well-developed.  HENT:     Head: Normocephalic and atraumatic.  Eyes:     Conjunctiva/sclera: Conjunctivae normal.  Cardiovascular:     Rate and Rhythm: Normal rate and regular rhythm.     Heart sounds: No murmur heard. Pulmonary:     Effort: Pulmonary effort is normal. No respiratory distress.     Breath sounds: Normal breath sounds.  Abdominal:     Palpations: Abdomen is soft.     Tenderness: There is no abdominal tenderness.  Musculoskeletal:        General: No swelling.     Cervical back: Neck supple.  Skin:    General: Skin is  warm and dry.     Capillary Refill: Capillary refill takes less than 2 seconds.  Neurological:     Mental Status: She is alert.  Psychiatric:        Mood and Affect: Mood normal.      UC Treatments / Results  Labs (all labs ordered are listed, but only abnormal results are displayed) Labs Reviewed - No data to display  EKG   Radiology No results found.  Procedures Procedures (including critical care time)  Medications Ordered in UC Medications - No data to display  Initial Impression / Assessment and Plan / UC Course  I have reviewed the triage vital signs and the nursing notes.  Pertinent labs & imaging results that were available during my care of the patient were reviewed by me and considered in my medical decision making (see chart for details).     Will treat for acute sinusitis.  Patient overall well-appearing in no acute distress, vitals within normal limits, lungs clear to auscultation.  Albuterol  inhaler sent to use as needed.  Patient advised to return if she develops any trouble with her breathing or develops fever over the next few days after starting antibiotic.  Supportive care discussed. Final Clinical Impressions(s) / UC Diagnoses   Final diagnoses:  Acute non-recurrent sinusitis, unspecified location  Acute cough     Discharge Instructions      Take antibiotic as prescribed. Recommend Flonase  and Mucinex  for congestion. Can take Delsym  or Robitussin cough syrup which is over-the-counter for cough  as needed. Use inhaler as needed for shortness of breath or wheezing. If symptoms become worse or you develop fevers please return for evaluation.   ED Prescriptions     Medication Sig Dispense Auth. Provider   doxycycline  (VIBRAMYCIN ) 100 MG capsule Take 1 capsule (100 mg total) by mouth 2 (two) times daily for 7 days. 14 capsule Ward, Laurita Peron Z, PA-C   albuterol  (VENTOLIN  HFA) 108 (90 Base) MCG/ACT inhaler Inhale 1-2 puffs into the lungs every 6  (six) hours as needed for wheezing or shortness of breath. 1 each Ward, Harlene PEDLAR, PA-C      PDMP not reviewed this encounter.   Ward, Harlene PEDLAR, PA-C 11/27/23 [phone]

## 2023-11-30 ENCOUNTER — Telehealth: Payer: Self-pay

## 2023-11-30 NOTE — Telephone Encounter (Signed)
 Incoming call/information:  phone message, [phone] Brenda Valencia was seen 7/12 and is calling back regarding her eyes stating that there's drainage. Based off her chart, it doesn't seem like she was really seen for that so I told her more than likely she would have to come back in. [phone]  Outgoing call/information:  Patient calling stating her eye's are really draining, not red, no pain but a lot of discharge, requests advise. Do I need drops or will the medication I was given for my sinus infection be ok. No fever.  Msg sent to Dr. Vonna for review.  Pharmacy: [facility]: PCN

## 2023-12-01 ENCOUNTER — Encounter: Payer: Self-pay | Admitting: Emergency Medicine

## 2023-12-01 ENCOUNTER — Ambulatory Visit
Admission: EM | Admit: 2023-12-01 | Discharge: 2023-12-01 | Disposition: A | Discharge status: Home / Self Care | Attending: Family Medicine | Admitting: Family Medicine

## 2023-12-01 DIAGNOSIS — R051 Acute cough: Secondary | ICD-10-CM | POA: Diagnosis not present

## 2023-12-01 DIAGNOSIS — H109 Unspecified conjunctivitis: Secondary | ICD-10-CM

## 2023-12-01 LAB — POC SARS CORONAVIRUS 2 AG -  ED: SARS Coronavirus 2 Ag: NEGATIVE

## 2023-12-01 MED ORDER — HYDROCODONE BIT-HOMATROP MBR 5-1.5 MG/5ML PO SOLN: 5.0000 mL | Freq: Four times a day (QID) | ORAL | 0 refills | Status: DC | PRN

## 2023-12-01 MED ORDER — TOBRAMYCIN 0.3 % OP SOLN: OPHTHALMIC | 0 refills | Status: DC

## 2023-12-01 NOTE — Discharge Instructions (Addendum)
 Be aware, your cough medication may cause drowsiness. Please do not drive, operate heavy machinery or make important decisions while on this medication, it can cloud your judgement.  Your COVID test was negative today.

## 2023-12-01 NOTE — ED Triage Notes (Signed)
 Pt present c/o bilateral eye pressure, mucus build up in her eyes, sinus pressure, and severe cough. Eye problem started on 11/22/23 as well as the cough.

## 2023-12-02 NOTE — ED Provider Notes (Signed)
 [facility]ARE CENTER   [id number] 12/01/23 Arrival Time: 0818  ASSESSMENT & PLAN:  1. Conjunctivitis of both eyes, unspecified conjunctivitis type   2. Acute cough    Discussed typical duration of likely viral illness. Meds ordered this encounter  Medications   tobramycin  (TOBREX ) 0.3 % ophthalmic solution    Sig: Place one drop in each eye every 6 hours while awake for up to one week.    Dispense:  5 mL    Refill:  0   HYDROcodone  bit-homatropine (HYCODAN) 5-1.5 MG/5ML syrup    Sig: Take 5 mLs by mouth every 6 (six) hours as needed for cough.    Dispense:  90 mL    Refill:  0    Results for orders placed or performed during the hospital encounter of 12/01/23  POC SARS Coronavirus 2 Ag-ED - Nasal Swab   Collection Time: 12/01/23  8:51 AM  Result Value Ref Range   SARS Coronavirus 2 Ag Negative Negative   OTC symptom care as needed.    Follow-up Information     [facility]).   Specialty: Urgent Care Why: As needed. Contact information: [address] [place]  [address] [phone]                Reviewed expectations re: course of current medical issues. Questions answered. Outlined signs and symptoms indicating need for more acute intervention. Understanding verbalized. After Visit Summary given.   SUBJECTIVE: History from: Patient. Brenda Valencia is a 53 y.o. female. Pt present c/o bilateral eye pressure, mucus build up in her eyes, sinus pressure, and severe cough. Eye problem started on 11/22/23 as well as the cough.  OBJECTIVE:  Vitals:   12/01/23 0838  BP: 124/80  Pulse: 97  Resp: 18  Temp: 100.3 F (37.9 C)  TempSrc: Oral  SpO2: 92%  Weight: 104.3 kg    General appearance: alert; no distress Eyes: PERRLA; EOMI; conjunctivae with slight injection and watery drainage HENT: [place]; AT; with nasal congestion;  Neck: supple  Lungs: speaks full sentences without  difficulty; unlabored; clear; active cough Extremities: no edema Skin: warm and dry Neurologic: normal gait Psychological: alert and cooperative; normal mood and affect  Labs: Results for orders placed or performed during the hospital encounter of 12/01/23  POC SARS Coronavirus 2 Ag-ED - Nasal Swab   Collection Time: 12/01/23  8:51 AM  Result Value Ref Range   SARS Coronavirus 2 Ag Negative Negative   Labs Reviewed  POC SARS CORONAVIRUS 2 AG -  ED    Imaging: No results found.  Allergies  Allergen Reactions   Penicillins Hives, Nausea And Vomiting and Nausea Only    Past Medical History:  Diagnosis Date   Abnormal glucose tolerance in mother complicating pregnancy 10/19/2011   Elevated early 1hr gtt; normal 3hr gtt x2   Ectopic pregnancy    Treated with MTX   Fibroids    H/O varicella    Headache(784.0)    otc med prn   Hypertension    Rh negative status during pregnancy 10/19/2011   Sarcoidosis    Seasonal allergies    Social History   Socioeconomic History   Marital status: Married    Spouse name: Not on file   Number of children: Not on file   Years of education: Not on file   Highest education level: Not on file  Occupational History   Not on file  Tobacco Use   Smoking  status: Never   Smokeless tobacco: Never  Vaping Use   Vaping status: Never Used  Substance and Sexual Activity   Alcohol use: No   Drug use: No   Sexual activity: Yes    Birth control/protection: Surgical    Comment: btl  Other Topics Concern   Not on file  Social History Narrative   Not on file   Social Drivers of Health   Financial Resource Strain: Not on file  Food Insecurity: No Food Insecurity (07/14/2023)   Hunger Vital Sign    Worried About Running Out of Food in the Last Year: Never true    Ran Out of Food in the Last Year: Never true  Transportation Needs: No Transportation Needs (07/14/2023)   PRAPARE - Transportation    Lack of Transportation (Medical): No     Lack of Transportation (Non-Medical): No  Physical Activity: Not on file  Stress: Not on file  Social Connections: Not on file  Intimate Partner Violence: Not At Risk (07/14/2023)   Humiliation, Afraid, Rape, and Kick questionnaire    Fear of Current or Ex-Partner: No    Emotionally Abused: No    Physically Abused: No    Sexually Abused: No   Family History  Problem Relation Age of Onset   Hypertension Mother    Arthritis Mother    Cancer Mother    Heart disease Father    Hypertension Father    Alcohol abuse Father    Alcohol abuse Brother    Past Surgical History:  Procedure Laterality Date   axillary nodes      2002   BRONCHIAL NEEDLE ASPIRATION BIOPSY  07/15/2023   Procedure: BRONCHIAL NEEDLE ASPIRATION BIOPSIES;  Surgeon: Jude Harden GAILS, MD;  Location: [facility];  Service: Cardiopulmonary;;   BRONCHIAL WASHINGS  07/15/2023   Procedure: BRONCHIAL WASHINGS;  Surgeon: Jude Harden GAILS, MD;  Location: [facility];  Service: Cardiopulmonary;;   CESAREAN SECTION  07/2006   NRFHR   CYSTOSCOPY  10/19/2011   Procedure: PHYLLIS;  Surgeon: Nena DELENA App, MD;  Location: [facility];  Service: Gynecology;  Laterality: N/A;   ENDOBRONCHIAL ULTRASOUND Bilateral 07/15/2023   Procedure: ENDOBRONCHIAL ULTRASOUND;  Surgeon: Jude Harden GAILS, MD;  Location: [facility];  Service: Cardiopulmonary;  Laterality: Bilateral;   TUBAL LIGATION     VIDEO BRONCHOSCOPY  07/15/2023   Procedure: VIDEO BRONCHOSCOPY;  Surgeon: Jude Harden GAILS, MD;  Location: [facility];  Service: Cardiopulmonary;;   WISDOM TOOTH EXTRACTION     as teenager     Rolinda Rogue, MD 12/02/23 1050

## 2023-12-05 ENCOUNTER — Ambulatory Visit (INDEPENDENT_AMBULATORY_CARE_PROVIDER_SITE_OTHER)

## 2023-12-05 ENCOUNTER — Ambulatory Visit
Admission: EM | Admit: 2023-12-05 | Discharge: 2023-12-05 | Disposition: A | Discharge status: Home / Self Care | Attending: Physician Assistant | Admitting: Physician Assistant

## 2023-12-05 DIAGNOSIS — R053 Chronic cough: Secondary | ICD-10-CM | POA: Diagnosis not present

## 2023-12-05 DIAGNOSIS — R590 Localized enlarged lymph nodes: Secondary | ICD-10-CM | POA: Diagnosis not present

## 2023-12-05 DIAGNOSIS — H1033 Unspecified acute conjunctivitis, bilateral: Secondary | ICD-10-CM

## 2023-12-05 DIAGNOSIS — R0689 Other abnormalities of breathing: Secondary | ICD-10-CM | POA: Diagnosis not present

## 2023-12-05 DIAGNOSIS — R918 Other nonspecific abnormal finding of lung field: Secondary | ICD-10-CM | POA: Diagnosis not present

## 2023-12-05 DIAGNOSIS — R059 Cough, unspecified: Secondary | ICD-10-CM | POA: Diagnosis not present

## 2023-12-05 MED ORDER — POLYMYXIN B-TRIMETHOPRIM 10000-0.1 UNIT/ML-% OP SOLN: 1.0000 [drp] | OPHTHALMIC | 0 refills | Status: AC

## 2023-12-05 MED ORDER — PREDNISONE 20 MG PO TABS: 40.0000 mg | ORAL_TABLET | Freq: Every day | ORAL | 0 refills | Status: AC

## 2023-12-05 MED ORDER — BENZONATATE 100 MG PO CAPS: 100.0000 mg | ORAL_CAPSULE | Freq: Three times a day (TID) | ORAL | 0 refills | Status: AC

## 2023-12-05 MED ORDER — PROMETHAZINE-DM 6.25-15 MG/5ML PO SYRP: 5.0000 mL | ORAL_SOLUTION | Freq: Four times a day (QID) | ORAL | 0 refills | Status: AC | PRN

## 2023-12-05 NOTE — ED Triage Notes (Signed)
 I have been here twice already, I am struggling to breath at times, diagnosed with CJ (both eyes), I have been taking drops since Thursday and still having a lot of discharge. I did not fill the cough medication because Codeine makes me have a lot of nausea.

## 2023-12-05 NOTE — ED Provider Notes (Addendum)
 [name]    CSN: [id number] Arrival date & time: 12/05/23  0802      History   Chief Complaint Chief Complaint  Patient presents with   Follow-up    HPI Brenda Valencia is a 53 y.o. female.   Patient here today for evaluation of continued cough.  She reports that she was unable to take cough syrup prescribed initially due to codeine.  She also reports that conjunctivitis does not seem to be improving despite using medication as prescribed.  She has been seen by pulmonology in the recent past and was diagnosed with probable sarcoidosis.  She was advised to return for labs but has not done so yet.  She does have repeat CT scheduled in September.  The history is provided by the patient.    Past Medical History:  Diagnosis Date   Abnormal glucose tolerance in mother complicating pregnancy 10/19/2011   Elevated early 1hr gtt; normal 3hr gtt x2   Ectopic pregnancy    Treated with MTX   Fibroids    H/O varicella    Headache(784.0)    otc med prn   Hypertension    Rh negative status during pregnancy 10/19/2011   Sarcoidosis    Seasonal allergies     Patient Active Problem List   Diagnosis Date Noted   Mediastinal lymphadenopathy 07/15/2023   Tachycardia 07/13/2023   History of cesarean section 11/27/2022   Persistent cough 07/14/2018   Vitamin D  deficiency 02/24/2018   Other muscle spasm 02/24/2018   Awareness of heart beat 02/24/2018   Fatigue 02/24/2018   Abnormal glucose 02/24/2018   Menorrhagia 05/18/2014   Rh negative status during pregnancy 10/19/2011   Obesity 09/29/2011   Encounter for sterilization 09/29/2011   Breech presentation 09/29/2011   AMA (advanced maternal age) multigravida 35+ 03/19/2011   Penicillin allergy 03/19/2011   History of ectopic pregnancy 03/19/2011   Uterine leiomyoma 03/19/2011   Spotting in early pregnancy 02/23/2011    Past Surgical History:  Procedure Laterality Date   axillary nodes      2002    BRONCHIAL NEEDLE ASPIRATION BIOPSY  07/15/2023   Procedure: BRONCHIAL NEEDLE ASPIRATION BIOPSIES;  Surgeon: Jude Harden GAILS, MD;  Location: [facility];  Service: Cardiopulmonary;;   BRONCHIAL WASHINGS  07/15/2023   Procedure: BRONCHIAL WASHINGS;  Surgeon: Jude Harden GAILS, MD;  Location: [facility];  Service: Cardiopulmonary;;   CESAREAN SECTION  07/2006   NRFHR   CYSTOSCOPY  10/19/2011   Procedure: PHYLLIS;  Surgeon: Nena DELENA App, MD;  Location: [facility];  Service: Gynecology;  Laterality: N/A;   ENDOBRONCHIAL ULTRASOUND Bilateral 07/15/2023   Procedure: ENDOBRONCHIAL ULTRASOUND;  Surgeon: Jude Harden GAILS, MD;  Location: [facility];  Service: Cardiopulmonary;  Laterality: Bilateral;   TUBAL LIGATION     VIDEO BRONCHOSCOPY  07/15/2023   Procedure: VIDEO BRONCHOSCOPY;  Surgeon: Jude Harden GAILS, MD;  Location: [facility];  Service: Cardiopulmonary;;   WISDOM TOOTH EXTRACTION     as teenager    OB History     Gravida  3   Para  2   Term  2   Preterm      AB  1   Living  2      SAB      IAB      Ectopic  1   Multiple      Live Births  2            Home Medications    Prior to Admission  medications   Medication Sig Start Date End Date Taking? Authorizing Provider  albuterol  (VENTOLIN  HFA) 108 (90 Base) MCG/ACT inhaler Inhale 1-2 puffs into the lungs every 6 (six) hours as needed for wheezing or shortness of breath. 11/27/23  Yes Ward, Harlene PEDLAR, PA-C  amLODipine  (NORVASC ) 10 MG tablet Take 1 tablet (10 mg total) by mouth daily. 10/08/23  Yes Vicci Rollo SAUNDERS, PA-C  benzonatate  (TESSALON ) 100 MG capsule Take 1 capsule (100 mg total) by mouth every 8 (eight) hours. 12/05/23  Yes Billy Asberry FALCON, PA-C  carvedilol  (COREG ) 25 MG tablet Take 1 tablet (25 mg total) by mouth 2 (two) times daily with a meal. 10/08/23  Yes Vicci Rollo SAUNDERS, PA-C  dextromethorphan -guaiFENesin  (MUCINEX  DM) 30-600 MG 12hr tablet Take 1 tablet by mouth 2 (two) times daily as needed for cough.  07/15/23  Yes Jillian Buttery, MD  Multiple Vitamin (MULTIVITAMIN) tablet Take 1 tablet by mouth daily.   Yes [provider]  predniSONE  (DELTASONE ) 20 MG tablet Take 2 tablets (40 mg total) by mouth daily with breakfast for 5 days. 12/05/23 12/10/23 Yes Billy Asberry FALCON, PA-C  promethazine -dextromethorphan  (PROMETHAZINE -DM) 6.25-15 MG/5ML syrup Take 5 mLs by mouth 4 (four) times daily as needed for cough. 12/05/23  Yes Billy Asberry FALCON, PA-C  trimethoprim -polymyxin b  (POLYTRIM ) ophthalmic solution Place 1 drop into both eyes every 4 (four) hours for 7 days. 12/05/23 12/12/23 Yes Billy Asberry FALCON, PA-C  acetaminophen  (TYLENOL ) 500 MG tablet Take 1,000 mg by mouth every 6 (six) hours as needed for mild pain (pain score 1-3), moderate pain (pain score 4-6) or fever.    [provider]  Blood Pressure Monitoring (OMRON 3 SERIES BP MONITOR) DEVI Use to check blood pressure 07/15/23   Jillian Buttery, MD  cetirizine  (ZYRTEC ) 10 MG tablet Take 1 tablet (10 mg total) by mouth daily. 02/03/21   Mound, Haley E, FNP  Cyanocobalamin (VITAMIN B-12 PO) Take 1 tablet by mouth daily. Patient not taking: Reported on 11/27/2023    [provider]  fluticasone  (FLONASE ) 50 MCG/ACT nasal spray Place 1 spray into both nostrils daily. Patient taking differently: Place 1 spray into both nostrils as needed for allergies. 06/12/22   Lynwood Lenis, PA-C  diphenhydrAMINE  (BENADRYL ) 25 MG tablet Take 25 mg by mouth every 6 (six) hours as needed.  11/26/19  [provider]    Family History Family History  Problem Relation Age of Onset   Hypertension Mother    Arthritis Mother    Cancer Mother    Heart disease Father    Hypertension Father    Alcohol abuse Father    Alcohol abuse Brother     Social History Social History   Tobacco Use   Smoking status: Never   Smokeless tobacco: Never  Vaping Use   Vaping status: Never Used  Substance Use Topics   Alcohol use: No   Drug use: No      Allergies   Codeine and Penicillins   Review of Systems Review of Systems  Constitutional:  Negative for chills and fever.  HENT:  Positive for congestion. Negative for ear pain and sore throat.   Eyes:  Positive for discharge and redness.  Respiratory:  Positive for cough. Negative for shortness of breath and wheezing.   Gastrointestinal:  Negative for abdominal pain, diarrhea, nausea and vomiting.     Physical Exam Triage Vital Signs ED Triage Vitals  Encounter Vitals Group     BP --      Girls  Systolic BP Percentile --      Girls Diastolic BP Percentile --      Boys Systolic BP Percentile --      Boys Diastolic BP Percentile --      Pulse --      Resp --      Temp --      Temp src --      SpO2 --      Weight 12/05/23 0810 230 lb (104.3 kg)     Height 12/05/23 0810 5' 6 (1.676 m)     Head Circumference --      Peak Flow --      Pain Score 12/05/23 0807 0     Pain Loc --      Pain Education --      Exclude from Growth Chart --    No data found.  Updated Vital Signs BP 118/79 (BP Location: Left Arm)   Pulse 98   Temp 98.4 F (36.9 C) (Oral)   Resp (!) 22   Ht 5' 6 (1.676 m)   Wt 230 lb (104.3 kg)   LMP  (LMP Unknown)   SpO2 94%   BMI 37.12 kg/m   Visual Acuity Right Eye Distance:   Left Eye Distance:   Bilateral Distance:    Right Eye Near:   Left Eye Near:    Bilateral Near:     Physical Exam Vitals and nursing note reviewed.  Constitutional:      General: She is not in acute distress.    Appearance: Normal appearance. She is not ill-appearing.  HENT:     Head: Normocephalic and atraumatic.     Nose: No congestion.     Mouth/Throat:     Mouth: Mucous membranes are moist.     Pharynx: No oropharyngeal exudate or posterior oropharyngeal erythema.  Eyes:     Comments: Conjunctiva mildly injected bilaterally  Cardiovascular:     Rate and Rhythm: Normal rate and regular rhythm.     Heart sounds: Normal heart sounds. No murmur  heard. Pulmonary:     Effort: Pulmonary effort is normal. No respiratory distress.     Breath sounds: Normal breath sounds. No wheezing, rhonchi or rales.     Comments: Deep breathing produces cough Skin:    General: Skin is warm and dry.  Neurological:     Mental Status: She is alert.  Psychiatric:        Mood and Affect: Mood normal.        Thought Content: Thought content normal.      UC Treatments / Results  Labs (all labs ordered are listed, but only abnormal results are displayed) Labs Reviewed - No data to display  EKG   Radiology DG Chest 2 View Result Date: 12/05/2023 CLINICAL DATA:  Cough and difficulty breathing. EXAM: CHEST - 2 VIEW COMPARISON:  CT angio chest from 07/13/2023 FINDINGS: Heart size appears normal. Diffuse increased interstitial markings throughout both lungs. Bilateral pulmonary nodules are identified. The largest of which is in the left lower lung measuring 1.4 cm. On the CT from 07/13/2023 there were multiple bilateral pulmonary nodules which were predominantly subpleural or peribronchovascular in location suggesting a perilymphatic distribution. Persistent bilateral hilar adenopathy noted. No pleural fluid or focal consolidation. Visualized osseous structures are unremarkable. IMPRESSION: 1. Diffuse increased interstitial markings throughout both lungs. Bilateral pulmonary nodules and hilar adenopathy. These findings are very similar to the chest CT from 07/13/2023. Bilateral hilar adenopathy and perilymphatic nodularity are typical findings  of sarcoid. Differential considerations include lymphoproliferative disorder or metastatic disease. Referral to pulmonary medicine for further management is advised. Electronically Signed   By: Waddell Calk M.D.   On: 12/05/2023 08:49    Procedures Procedures (including critical care time)  Medications Ordered in UC Medications - No data to display  Initial Impression / Assessment and Plan / UC Course  I have  reviewed the triage vital signs and the nursing notes.  Pertinent labs & imaging results that were available during my care of the patient were reviewed by me and considered in my medical decision making (see chart for details).    Patient's x-ray similar to prior CT and support sarcoidosis diagnosis.  Given this recommended steroid burst and follow-up with pulmonology as soon as possible.  Patient expressed understanding.  Cough syrup prescribed to replace codeine cough syrup as patient reports she does not tolerate.  Will also replace antibiotic eyedrops with Polytrim  drops.  Advised follow-up if no gradual improvement of conjunctivitis or with any further concerns.  Patient expressed understanding.  Final Clinical Impressions(s) / UC Diagnoses   Final diagnoses:  Persistent cough  Acute conjunctivitis of both eyes, unspecified acute conjunctivitis type   Discharge Instructions   None    ED Prescriptions     Medication Sig Dispense Auth. Provider   predniSONE  (DELTASONE ) 20 MG tablet Take 2 tablets (40 mg total) by mouth daily with breakfast for 5 days. 10 tablet Billy Stabs F, PA-C   benzonatate  (TESSALON ) 100 MG capsule Take 1 capsule (100 mg total) by mouth every 8 (eight) hours. 21 capsule Billy Stabs F, PA-C   promethazine -dextromethorphan  (PROMETHAZINE -DM) 6.25-15 MG/5ML syrup Take 5 mLs by mouth 4 (four) times daily as needed for cough. 118 mL Billy Stabs F, PA-C   trimethoprim -polymyxin b  (POLYTRIM ) ophthalmic solution Place 1 drop into both eyes every 4 (four) hours for 7 days. 10 mL Billy Stabs FALCON, PA-C      PDMP not reviewed this encounter.   Billy Stabs FALCON, PA-C 12/05/23 1217    Billy Stabs FALCON, PA-C 12/05/23 1218

## 2023-12-06 ENCOUNTER — Ambulatory Visit: Payer: Self-pay

## 2023-12-06 ENCOUNTER — Telehealth: Payer: Self-pay

## 2023-12-06 NOTE — Telephone Encounter (Signed)
 Thanks

## 2023-12-06 NOTE — Telephone Encounter (Signed)
 FYI Only or Action Required?: FYI only for provider.  Patient is followed in Pulmonology for sarcoidosis, last seen on 08/19/2023 by Hope Almarie ORN, NP.  Called Nurse Triage reporting Follow-up and Cough.  Symptoms began a week ago.  Interventions attempted: Prescription medications: see UC D/C medications - pt has not started steroids yet.  Symptoms are: unchanged.  Triage Disposition: Call Specialist Today  Patient/caregiver understands and will follow disposition?: Yes     Copied from CRM [id number]. Topic: Clinical - Pink Word Triage >> Dec 06, 2023 11:29 AM Nathanel DEL wrote: Reason for Triage: pt advised to call her dr today and see if she needs sooner appt than her next fup.   ----------------------------------------------------------------------- From previous Reason for Contact - Red Word Triage: Red Word that prompted transfer to Nurse Triage: pt went to UC yesterday.  Hard to breathe.  Pt could not catch her breath. Pt started on 5 day steroid, but going to p/u in a few minutes.  Pt has cough, and battling sinus about 2 weeks. Pt informed to call her dr today. C/b  [phone] Reason for Disposition  Condition / symptoms SAME (not improving)    Has not started treatment yet. Will schedule F/U appt with [facility]. Patient verbalized understanding and to call back with worsening symptoms.  Answer Assessment - Initial Assessment Questions 1. MAIN CONCERN OR SYMPTOM:  What is your main concern right now? What question do you have? What's the main symptom you're worried about? (e.g., breathing difficulty, ankle swelling, weight gain.)     Cough that prompted pt to go to UC 2. ONSET: When did the  sx  start?     X 1 week 3. BETTER-SAME-WORSE: Are you getting better, staying the same, or getting worse compared to the day you were discharged?     Same - pt is en route to pharmacy to pick up Rx prescribed at UC 4. HOSPITALIZATION: How long were you hospitalized? (e.g.,  days)     N/a 5. DISCHARGE DIAGNOSIS:  What problem or disease were you hospitalized for?     Persistent cough 6. DISCHARGE DATE: What date were you discharged from the hospital?     12/05/2023 7. DISCHARGE DOCTOR: Who is the main doctor taking care of you now?     Billy Asberry FALCON, PA-C 8. DISCHARGE APPOINTMENT: Have you scheduled a follow-up discharge appointment with your doctor?     Not yet 9. DISCHARGE MEDICINES: Did the doctor (or NP/PA) who discharged you order any new medicines for you to use? If yes, have you filled the prescription and started taking the medicine?       Acetaminophen  1,000 mg Every 6 hours PRN  Albuterol  Sulfate 108 (90 Base) MCG/ACT 1-2 puffs Inhalation Every 6 hours PRN  amLODIPine  Besylate 10 mg Oral Daily  Benzonatate  100 mg Oral Every 8 hours  Carvedilol  25 mg Oral 2 times daily with meals  Cetirizine  HCl 10 mg Oral Daily  Cyanocobalamin 1 tablet Daily Patient not taking: Reported on 11/27/2023  Dextromethorphan -guaiFENesin  30-600 MG 1 tablet Oral 2 times daily PRN  Fluticasone  Propionate 50 MCG/ACT 1 spray Each Nare Daily Patient taking differently: Place 1 spray into both nostrils as needed for allergies.  Multiple Vitamin 1 tablet Daily  Polymyxin B -Trimethoprim  10000-0.1 UNIT/ML-% 1 drop Both Eyes Every 4 hours  predniSONE  40 mg Oral Daily with breakfast  Promethazine -DM 6.25-15 MG/5ML 5 mLs Oral 4 times daily PRN  10. PAIN: Is there any pain? If Yes, ask: How bad  is it?  (Scale 0-10; or none, mild, moderate, severe)       N/a 11. FEVER: Do you have a fever? If Yes, ask: What is it, how was it measured  and when did it start?       N/a 12. OTHER SYMPTOMS: Do you have any other symptoms?       N/a  Of note, pt reports last [facility] office visit had labwork done and is inquiring if it is still outstanding. [facility] reported that there is an outstanding lab from 08/19/2023 that still needs to be collected. Pt verbalized  understanding and will go back to lab to get bloodwork drawn. Pt will call back with any worsening sx or any issues with needing new lab order.  Protocols used: Post-Hospitalization Follow-up Call-A-AH

## 2023-12-06 NOTE — Telephone Encounter (Unsigned)
 Copied from CRM [id number]. Topic: Clinical - Pink Word Triage >> Dec 06, 2023 11:29 AM Nathanel DEL wrote: Reason for Triage: pt advised to call her dr today and see if she needs sooner appt than her next fup.   ----------------------------------------------------------------------- From previous Reason for Contact - Red Word Triage: Red Word that prompted transfer to Nurse Triage: pt went to UC yesterday.  Hard to breathe.  Pt could not catch her breath. Pt started on 5 day steroid, but going to p/u in a few minutes.  Pt has cough, and battling sinus about 2 weeks. Pt informed to call her dr today. C/b  [phone]

## 2023-12-06 NOTE — Telephone Encounter (Unsigned)
 Copied from CRM [id number]. Topic: Clinical - Pink Word Triage >> Dec 06, 2023 11:29 AM Brenda Valencia wrote: Reason for Triage: ***   ----------------------------------------------------------------------- From previous Reason for Contact - Red Word Triage: Red Word that prompted transfer to Nurse Triage: pt went to UC yesterday.  Hard to breathe.  Pt could not catch her breath. Pt started on 5 day steroid, but going to p/u in a few minutes.  Pt has cough, and battling sinus about 2 weeks. Pt informed to call her dr today. C/b  [phone]

## 2023-12-06 NOTE — Telephone Encounter (Signed)
 Pt went to UC and hasn't started meds yet from them has F/U with you on 02/03/2024 just FYI

## 2023-12-16 ENCOUNTER — Other Ambulatory Visit: Payer: Self-pay

## 2023-12-16 DIAGNOSIS — R059 Cough, unspecified: Secondary | ICD-10-CM | POA: Diagnosis not present

## 2023-12-16 DIAGNOSIS — R590 Localized enlarged lymph nodes: Secondary | ICD-10-CM | POA: Diagnosis not present

## 2023-12-17 ENCOUNTER — Ambulatory Visit: Payer: Self-pay | Admitting: Primary Care

## 2023-12-17 LAB — ANGIOTENSIN CONVERTING ENZYME: Angiotensin-Converting Enzyme: 26 U/L (ref 9–67)

## 2024-01-20 ENCOUNTER — Ambulatory Visit
Admission: RE | Admit: 2024-01-20 | Discharge: 2024-01-20 | Disposition: A | Discharge status: Home / Self Care | Source: Ambulatory Visit | Attending: Primary Care

## 2024-01-20 DIAGNOSIS — R590 Localized enlarged lymph nodes: Secondary | ICD-10-CM | POA: Diagnosis not present

## 2024-01-20 DIAGNOSIS — R059 Cough, unspecified: Secondary | ICD-10-CM

## 2024-02-03 ENCOUNTER — Encounter: Payer: Self-pay | Admitting: Primary Care

## 2024-02-03 ENCOUNTER — Ambulatory Visit: Admitting: Primary Care

## 2024-02-03 ENCOUNTER — Ambulatory Visit: Admitting: Pulmonary Disease

## 2024-02-03 ENCOUNTER — Other Ambulatory Visit: Payer: Self-pay | Admitting: *Deleted

## 2024-02-03 VITALS — BP 108/64 | HR 62 | Temp 98.3°F | Ht 66.0 in | Wt 219.8 lb

## 2024-02-03 DIAGNOSIS — D869 Sarcoidosis, unspecified: Secondary | ICD-10-CM | POA: Diagnosis not present

## 2024-02-03 DIAGNOSIS — R053 Chronic cough: Secondary | ICD-10-CM | POA: Diagnosis not present

## 2024-02-03 DIAGNOSIS — R590 Localized enlarged lymph nodes: Secondary | ICD-10-CM | POA: Diagnosis not present

## 2024-02-03 LAB — PULMONARY FUNCTION TEST
DL/VA % pred: 130 %
DL/VA: 5.46 ml/min/mmHg/L
DLCO cor % pred: 86 %
DLCO cor: 19.61 ml/min/mmHg
DLCO unc % pred: 86 %
DLCO unc: 19.61 ml/min/mmHg
FEF 25-75 Post: 1.25 L/s
FEF 25-75 Pre: 1.2 L/s
FEF2575-%Change-Post: 4 %
FEF2575-%Pred-Post: 45 %
FEF2575-%Pred-Pre: 42 %
FEV1-%Change-Post: -1 %
FEV1-%Pred-Post: 60 %
FEV1-%Pred-Pre: 61 %
FEV1-Post: 1.8 L
FEV1-Pre: 1.83 L
FEV1FVC-%Change-Post: -1 %
FEV1FVC-%Pred-Pre: 94 %
FEV6-%Change-Post: 0 %
FEV6-%Pred-Post: 65 %
FEV6-%Pred-Pre: 65 %
FEV6-Post: 2.41 L
FEV6-Pre: 2.41 L
FEV6FVC-%Change-Post: 0 %
FEV6FVC-%Pred-Post: 101 %
FEV6FVC-%Pred-Pre: 101 %
FVC-%Change-Post: 0 %
FVC-%Pred-Post: 64 %
FVC-%Pred-Pre: 64 %
FVC-Post: 2.44 L
FVC-Pre: 2.44 L
Post FEV1/FVC ratio: 74 %
Post FEV6/FVC ratio: 99 %
Pre FEV1/FVC ratio: 75 %
Pre FEV6/FVC Ratio: 99 %
RV % pred: 50 %
RV: 1 L
TLC % pred: 62 %
TLC: 3.4 L

## 2024-02-03 NOTE — Patient Instructions (Addendum)
 VISIT SUMMARY: Today, you had a follow-up appointment to discuss your pulmonary sarcoidosis and recent imaging results. Your cough has improved significantly, and you are not experiencing chest pain or shortness of breath, except when wearing a mask for extended periods. We also discussed your dry eye symptoms and your plan to have your eyes rechecked.   YOUR PLAN: -PULMONARY SARCOIDOSIS WITH PROGRESSIVE PULMONARY FIBROSIS: Pulmonary sarcoidosis is a condition where clusters of inflammatory cells form in the lungs, which can lead to scarring (fibrosis). Your recent CT scan shows that some areas of your lungs have worsened. We will consult with Dr. Jude to discuss your management plan, consider a follow-up CT scan in three months, and schedule pulmonary function tests to monitor your lung function.  -CHRONIC COUGH: Your chronic cough has improved and now occurs only a few times a day without causing significant discomfort. This improvement is noted since your previous upper respiratory infection and hospitalization.  -DRY EYE SYMPTOMS: Dry eye symptoms can occur when your eyes do not produce enough tears or the right quality of tears. You are currently experiencing these symptoms, and we recommend annual eye exams with an eye doctor to monitor and manage this condition.  INSTRUCTIONS: Please follow up with Dr. Jude regarding your pulmonary sarcoidosis management plan. Schedule a follow-up CT scan in three months and pulmonary function tests. Additionally, ensure you have annual eye exams to monitor your dry eye symptoms.  Follow-up Please schedule PFTs first available and Follow-up first available with Dr. Jude in [place]    Sarcoidosis: What to Know  Sarcoidosis is a disease that causes irritation and swelling called inflammation. Normally, cells that are part of your body's defense system (immune system) attack harmful things in your body, such as germs. But when you have sarcoidosis, your  immune system can cause swelling for no reason. This most often affects your lungs, but it can also affect: Your lymph nodes. Your eyes. Your skin. Your heart. Any other body tissue. Cells from your immune system may also form small lumps called granulomas in your body. What are the causes? The exact cause isn't known. If you have a family history of the disease, your immune system may be triggered by something around you, such as: Germs. Chemicals. Dust. Mold or mildew. What increases the risk? You may be more likely to get sarcoidosis if: Someone else in your family has or has had the disease. You're African American. You're of Northern European descent. You're 63-11 years old. You're female. You work in a place where you're around: Metals. Chemicals. These include chemicals used to kill insects. Mold. 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 What are the signs or symptoms? Your symptoms may be mild. They may also depend on what part of your body is affected. Common symptoms include: Coughing. Shortness of breath. Chest pain. Other symptoms include: Night sweats. Fever. Weight loss. Tiredness. Swollen lymph nodes. Joint pain. In some cases, you may not have symptoms. How is this diagnosed? You may be diagnosed based on your symptoms, medical history, and an exam. You may also need tests. These may include: A chest X-ray. A CT scan. An MRI. A PET scan. Lung function tests. These tests check your breathing and look for other problems with your lungs. A biopsy. This is when a small piece of tissue is removed for testing. How is this treated? If your symptoms are mild or you have no symptoms, you may not need treatment. If your symptoms bother you or are bad, you  may need to take medicines. These may include: Prednisone . This is a steroid that reduces swelling. Hydroxychloroquine. This may be used if your skin, eyes, or brain are affected. Immunosuppressants. These are medicines  that weaken your body's immune system. Follow these instructions at home: Take your medicines only as told. Do not smoke, vape, or use nicotine or tobacco. Stay away from: People who are smoking. Dust. Chemicals. Stay indoors on days when air quality is poor. Ask what things are safe for you to do at home. Ask when you can go back to work or school. Where to find more information National [employer], [employer], [facility] [employer] [facility]: [employer].ee Contact a health care provider if: You have trouble seeing. You have a dry cough that won't go away. Your heart is beating in a way that's not normal. You have pain or aches in: Your joints. Your hands. Your feet. You have a rash and you're not sure why. Get help right away if: You have chest pain. You have trouble breathing. These symptoms may be an emergency. Call 911 right away. Do not wait to see if the symptoms will go away. Do not drive yourself to the hospital. This information is not intended to replace advice given to you by your health care provider. Make sure you discuss any questions you have with your health care provider. Document Revised: 12/10/2022 Document Reviewed: 12/10/2022 Elsevier Patient Education  2024 [employer].

## 2024-02-03 NOTE — Progress Notes (Signed)
 Full PFT performed today.

## 2024-02-03 NOTE — Progress Notes (Signed)
 @Patient  ID: Brenda Valencia, female    DOB: April 23, 1971, 53 y.o.   MRN: [id number]  Chief Complaint  Patient presents with   Follow-up    Cough is better, denies other symptoms    Referring provider: No ref. provider found  HPI: 53 year old female, never smoked. PMH significant for  mediastinal lymphadenopathy, pulmonary nodules, tachycardia, obesity,   Previous [facility] pulmonary encounter: 08/19/2023 Discussed the use of AI scribe software for clinical note transcription with the patient, who gave verbal consent to proceed.  History of Present Illness   Brenda Valencia is a 53 year old female who presents for a hospital follow-up after an episode of tachycardia and elevated blood pressure.  She was hospitalized from February 25th to February 27th due to tachycardia and elevated blood pressure. During her hospital stay, labs showed an elevated troponin level, but her thyroid  level and respiratory panel were normal. A CT angiogram of the chest was negative for a blood clot but revealed lung nodules and hilar or distal lymphadenopathy. A bronchoscopy was performed, and pathology results were not consistent with malignancy or infection, and no granulomas were found.  Since discharge, the respiratory infection has cleared up, but she experiences an intermittent dry cough that flares up with seasonal allergies. No current chest pain or trouble breathing.   Her current medications include Coreg  5 mg twice daily, amlodipine  10 mg daily, cetirizine  as needed, mucus relief DM as needed, and Flonase  nasal spray as needed. She also takes a multivitamin and has previously taken vitamin D  supplements, but has not taken any for the past month. Recent lab work showed normal potassium and calcium levels.      02/03/2024- interim hx  Discussed the use of AI scribe software for clinical note transcription with the patient, who gave verbal consent to proceed.  History of Present Illness Brenda Gallego  Valencia is a 53 year old female with pulmonary sarcoidosis who presents for follow-up after recent imaging.  She was previously hospitalized for tachycardia and hypertension following an upper respiratory illness in February 2024. Imaging at that time showed lung nodules and lymphadenopathy. Bronchoscopy was performed and pathology results were not consistent with malignancy or infection, and no granulomas were found. An ACE level was ordered and was normal at 26.  Recent follow-up CT imaging in September demonstrated irregular and speculated nodules and consolidation throughout the lungs, with some nodules unchanged but significant areas of new involvement. There is evidence of tenting and distortion of the lungs.  Her cough has improved significantly since the last visit, although she still experiences a mild cough several times a day. No chest pain or shortness of breath, except when wearing a mask for extended periods, which is a new symptom for her.  She was previously on a short course of prednisone  for an upper respiratory infection, but no longer takes it. Blood work at that time indicated she did not need further steroids.  She mentions experiencing dry eyes and had a history of conjunctivitis in both eyes following an upper respiratory infection, which took two weeks to resolve. She plans to have her eyes rechecked as she feels something might be going on with her eyes.   Allergies  Allergen Reactions   Codeine Nausea Only   Penicillins Hives, Nausea And Vomiting and Nausea Only    Immunization History  Administered Date(s) Administered   Influenza Split 06/01/2011   Influenza-Unspecified 03/30/2018   PFIZER(Purple Top)SARS-COV-2 Vaccination 08/04/2019, 08/29/2019   Rho (D)  Immune Globulin  03/19/2011, 08/20/2011, 10/20/2011   Tdap 10/20/2011    Past Medical History:  Diagnosis Date   Abnormal glucose tolerance in mother complicating pregnancy 10/19/2011   Elevated early  1hr gtt; normal 3hr gtt x2   Ectopic pregnancy    Treated with MTX   Fibroids    H/O varicella    Headache(784.0)    otc med prn   Hypertension    Rh negative status during pregnancy 10/19/2011   Sarcoidosis    Seasonal allergies     Tobacco History: Social History   Tobacco Use  Smoking Status Never  Smokeless Tobacco Never   Counseling given: Not Answered   Outpatient Medications Prior to Visit  Medication Sig Dispense Refill   acetaminophen  (TYLENOL ) 500 MG tablet Take 1,000 mg by mouth every 6 (six) hours as needed for mild pain (pain score 1-3), moderate pain (pain score 4-6) or fever.     albuterol  (VENTOLIN  HFA) 108 (90 Base) MCG/ACT inhaler Inhale 1-2 puffs into the lungs every 6 (six) hours as needed for wheezing or shortness of breath. 1 each 0   amLODipine  (NORVASC ) 10 MG tablet Take 1 tablet (10 mg total) by mouth daily. 90 tablet 3   Blood Pressure Monitoring (OMRON 3 SERIES BP MONITOR) DEVI Use to check blood pressure 1 each 0   carvedilol  (COREG ) 25 MG tablet Take 1 tablet (25 mg total) by mouth 2 (two) times daily with a meal. 180 tablet 3   cetirizine  (ZYRTEC ) 10 MG tablet Take 1 tablet (10 mg total) by mouth daily. 30 tablet 0   dextromethorphan -guaiFENesin  (MUCINEX  DM) 30-600 MG 12hr tablet Take 1 tablet by mouth 2 (two) times daily as needed for cough. 20 tablet 0   fluticasone  (FLONASE ) 50 MCG/ACT nasal spray Place 1 spray into both nostrils daily. 16 g 0   Multiple Vitamin (MULTIVITAMIN) tablet Take 1 tablet by mouth daily.     promethazine -dextromethorphan  (PROMETHAZINE -DM) 6.25-15 MG/5ML syrup Take 5 mLs by mouth 4 (four) times daily as needed for cough. 118 mL 0   benzonatate  (TESSALON ) 100 MG capsule Take 1 capsule (100 mg total) by mouth every 8 (eight) hours. (Patient not taking: Reported on 02/03/2024) 21 capsule 0   Cyanocobalamin (VITAMIN B-12 PO) Take 1 tablet by mouth daily. (Patient not taking: Reported on 02/03/2024)     No facility-administered  medications prior to visit.   Review of Systems  Review of Systems  Constitutional: Negative.   Respiratory:  Negative for chest tightness, shortness of breath and wheezing.        Very sporadic cough   Cardiovascular:  Negative for chest pain.   Physical Exam  BP 108/64 (BP Location: Left Arm, Patient Position: Sitting, Cuff Size: Large)   Pulse 62   Temp 98.3 F (36.8 C) (Oral)   Ht 5' 6 (1.676 m)   Wt 219 lb 12.8 oz (99.7 kg)   SpO2 95%   BMI 35.48 kg/m  Physical Exam Constitutional:      Appearance: Normal appearance. She is well-developed.  HENT:     Head: Normocephalic and atraumatic.     Mouth/Throat:     Mouth: Mucous membranes are moist.     Pharynx: Oropharynx is clear.  Eyes:     Pupils: Pupils are equal, round, and reactive to light.  Cardiovascular:     Rate and Rhythm: Normal rate and regular rhythm.     Heart sounds: Normal heart sounds. No murmur heard. Pulmonary:     Effort: Pulmonary  effort is normal. No respiratory distress.     Breath sounds: Normal breath sounds. No wheezing or rhonchi.  Abdominal:     General: Bowel sounds are normal.     Palpations: Abdomen is soft.     Tenderness: There is no abdominal tenderness.  Musculoskeletal:        General: Normal range of motion.     Cervical back: Normal range of motion and neck supple.  Skin:    General: Skin is warm and dry.     Findings: No erythema or rash.  Neurological:     General: No focal deficit present.     Mental Status: She is alert and oriented to person, place, and time. Mental status is at baseline.  Psychiatric:        Mood and Affect: Mood normal.        Behavior: Behavior normal.        Thought Content: Thought content normal.        Judgment: Judgment normal.    Lab Results:  CBC    Component Value Date/Time   WBC 5.5 07/13/2023 0827   RBC 5.13 (H) 07/13/2023 0827   HGB 14.4 07/13/2023 0827   HCT 45.1 07/13/2023 0827   PLT 285 07/13/2023 0827   MCV 87.9 07/13/2023  0827   MCH 28.1 07/13/2023 0827   MCHC 31.9 07/13/2023 0827   RDW 13.3 07/13/2023 0827   LYMPHSABS 1.2 07/13/2023 0827   MONOABS 0.7 07/13/2023 0827   EOSABS 0.1 07/13/2023 0827   BASOSABS 0.0 07/13/2023 0827    BMET    Component Value Date/Time   NA 138 07/13/2023 0827   NA 139 11/25/2017 1015   K 3.7 07/13/2023 0827   CL 105 07/13/2023 0827   CO2 24 07/13/2023 0827   GLUCOSE 160 (H) 07/13/2023 0827   GLUCOSE 69 (L) 08/28/2011 0918   BUN 9 07/13/2023 0827   BUN 8 11/25/2017 1015   CREATININE 0.73 07/13/2023 0827   CALCIUM 9.6 07/13/2023 0827   GFRNONAA >60 07/13/2023 0827    BNP No results found for: BNP  ProBNP No results found for: PROBNP  Imaging: CT Chest Wo Contrast Result Date: 01/22/2024 CLINICAL DATA:  Lymphadenopathy, sarcoidosis * Tracking Code: BO * EXAM: CT CHEST WITHOUT CONTRAST TECHNIQUE: Multidetector CT imaging of the chest was performed following the standard protocol without IV contrast. RADIATION DOSE REDUCTION: This exam was performed according to the departmental dose-optimization program which includes automated exposure control, adjustment of the mA and/or kV according to patient size and/or use of iterative reconstruction technique. COMPARISON:  07/13/2023 FINDINGS: Cardiovascular: No significant vascular findings. Normal heart size. No pericardial effusion. Mediastinum/Nodes: Numerous large, noncalcified lymph nodes throughout the mediastinum and hila are unchanged, index pretracheal node measuring 2.5 x 1.5 cm (series 2, image 39). Thymic remnant in the anterior mediastinum. Thyroid  gland, trachea, and esophagus demonstrate no significant findings. Lungs/Pleura: Redemonstrated irregular and spiculated nodularity and consolidation throughout the lungs. Although many nodules and consolidations are not significantly changed when compared to prior examination, there are significant areas of new involvement, most notably in the right lung base, with a  sizable new consolidation (series 5, image 84) and in the anterior right upper lobe (series 5, image 43). Nodules and opacities are generally in a perilymphatic distribution, many concentrated along the fissures and pleural surfaces as well as the bronchovascular structures, and there are areas of fissural tenting and architectural distortion, which are increased compared to prior examination, for example in the posterior  right upper lobe (series 5, image 44). No pleural effusion or pneumothorax. Upper Abdomen: No acute abnormality. Hepatic steatosis. Probable hepatomegaly, incompletely imaged. Musculoskeletal: No chest wall abnormality. No acute osseous findings. IMPRESSION: 1. Redemonstrated irregular and spiculated nodularity and consolidation throughout the lungs. Although some nodules and consolidations are not significantly changed when compared to prior examination, there are significant areas of new involvement. Nodules and opacities are generally in a perilymphatic distribution, and there are areas of fissural tenting and architectural distortion, which are increased compared to prior examination. Constellation of findings is in keeping with reported diagnosis of pulmonary sarcoidosis, and demonstrates evidence of interval progression and evidence of developing massive fibrosis. 2. Numerous large, noncalcified lymph nodes throughout the mediastinum and hila are unchanged, in keeping with nodal sarcoidosis. 3. Hepatic steatosis. Probable hepatomegaly, incompletely imaged. Electronically Signed   By: Marolyn JONETTA Jaksch M.D.   On: 01/22/2024 17:49     Assessment & Plan:   1. Mediastinal lymphadenopathy  2. Chronic cough  3. Sarcoidosis (Primary)   Assessment and Plan Assessment & Plan Pulmonary sarcoidosis with progressive pulmonary fibrosis Previously hospitalized for tachycardia and hypertension following an upper respiratory illness in February 2024. Imaging at that time showed lung nodules and  lymphadenopathy. Bronchoscopy was performed and pathology results were not consistent with malignancy or infection, and no granulomas were found. ACE level was normal in July.  Recent CT scan in September 2025 reveals irregular and speculated nodules and consolidation throughout the lungs, with some nodules unchanged and others showing new involvement. Evidence of tenting and distortion of the lungs indicates progression of pulmonary sarcoidosis with fibrosis. Symptoms have improved, with no significant cough, chest pain, or shortness of breath, except when wearing a mask. Differential diagnosis includes sarcoidosis despite absence of granulomas. - Consult with Dr. Jude regarding management plan - Consider short-term interval follow-up CT scan in three-six months - Schedule pulmonary function tests (PFTs) that have already been ordered  Chronic cough Cough has significantly improved since last visit, now occurring only several times a day without being disruptive. No associated chest pain or significant shortness of breath. Improvement noted since previous upper respiratory infection and hospitalization.  Dry eye symptoms  Currently experiencing symptoms suggestive of dry eyes. No nodules noted on previous eye examination. - Recommend annual eye exams with an eye doctor   Almarie LELON Ferrari, NP 02/03/2024

## 2024-02-03 NOTE — Patient Instructions (Signed)
 Full PFT performed today.

## 2024-02-04 ENCOUNTER — Telehealth: Payer: Self-pay | Admitting: Primary Care

## 2024-02-04 NOTE — Telephone Encounter (Signed)
 Please let patient know I spoke with Dr. Jude, since she is asymptomatic and doing better there is no need for medication right now regarding her sarcoidosis. Diffusion capacity was good on her breathing test yesterday. We will continue to observe.

## 2024-02-04 NOTE — Telephone Encounter (Signed)
-----   Message from [place] [facility]. [place] sent at 02/03/2024 12:17 PM EDT ----- Regarding: RE: Progression of sarcoid, symptoms better DLCO is good If asymptomatic, I would hold off Rx & observe ----- Message ----- From: Hope Almarie ORN, NP Sent: 02/03/2024   8:59 AM EDT To: Harden Jude GAILS, MD Subject: Progression of sarcoid, symptoms better        Can you review patient's CT scan from September  CT showed progression and scarring in keeping with sarcoid dx Not on prednisone , does not sound like she has ever had a substantial course of steroids  Her cough has improved significantly No chest pain or shortness of breath Ace level in July was 26 I ordered PFTs but they were not scheduled, she will get those schedule on her way out  Thanks Dr. Jude -[place]

## 2024-02-04 NOTE — Telephone Encounter (Signed)
 Pt.notified

## 2024-02-06 ENCOUNTER — Ambulatory Visit: Payer: Self-pay | Admitting: Primary Care

## 2024-02-17 ENCOUNTER — Ambulatory Visit: Admitting: Primary Care

## 2024-05-04 ENCOUNTER — Ambulatory Visit (HOSPITAL_BASED_OUTPATIENT_CLINIC_OR_DEPARTMENT_OTHER): Admitting: Pulmonary Disease

## 2024-05-04 ENCOUNTER — Encounter (HOSPITAL_BASED_OUTPATIENT_CLINIC_OR_DEPARTMENT_OTHER): Payer: Self-pay | Admitting: Pulmonary Disease

## 2024-05-04 VITALS — BP 142/102 | HR 76 | Temp 98.5°F | Ht 66.0 in | Wt 219.4 lb

## 2024-05-04 DIAGNOSIS — I10 Essential (primary) hypertension: Secondary | ICD-10-CM

## 2024-05-04 DIAGNOSIS — D860 Sarcoidosis of lung: Secondary | ICD-10-CM

## 2024-05-04 DIAGNOSIS — D869 Sarcoidosis, unspecified: Secondary | ICD-10-CM

## 2024-05-04 DIAGNOSIS — R918 Other nonspecific abnormal finding of lung field: Secondary | ICD-10-CM | POA: Diagnosis not present

## 2024-05-04 NOTE — Progress Notes (Signed)
 Subjective:    Patient ID: Brenda Valencia, female    DOB: 11-Jul-1970, 53 y.o.   MRN: [id number]   53 yo never smoked presented with mediastinal lymphadenopathy, pulmonary nodules,    Hospitalized 06/2023 due to tachycardia and elevated blood pressure. During her hospital stay, labs showed an elevated troponin level, but her thyroid  level and respiratory panel were normal. A CT angiogram of the chest was negative for a blood clot but revealed lung nodules and hilar or distal lymphadenopathy. A bronchoscopy was performed, and pathology results were not consistent with malignancy or infection, and no granulomas were found.    Discussed the use of AI scribe software for clinical note transcription with the patient, who gave verbal consent to proceed.  History of Present Illness Brenda Valencia is a 53 year old female with presumed sarcoidosis who presents for follow-up of lung nodules and shortness of breath.  She had a chest scan and breathing test in September. She was hospitalized in the past for shortness of breath, when chest x-rays showed lung abnormalities. Since then her cough from a recent viral infection has resolved. She now has shortness of breath only with longer walks or climbing a flight of stairs and can walk from the parking lot to her building without difficulty.  She takes Coreg  for hypertension and her blood pressure was slightly elevated today.  A September scan showed pulmonary nodules and scarring. Pulmonary function testing showed a restrictive pattern with lung function at 65% predicted and diffusion at 86%. She is allergic to penicillin and not allergic to contrast dye.      Significant tests/ events reviewed  ACE 26  CT chest 01/2024 >> increased nodular consolidation & LNs  PFT 01/2024 ratio 75, mod restriction, FVC 64, TLC 62, DLCO 86  Review of Systems  neg for any significant sore throat, dysphagia, itching, sneezing, nasal congestion or excess/  purulent secretions, fever, chills, sweats, unintended wt loss, pleuritic or exertional cp, hempoptysis, orthopnea pnd or change in chronic leg swelling. Also denies presyncope, palpitations, heartburn, abdominal pain, nausea, vomiting, diarrhea or change in bowel or urinary habits, dysuria,hematuria, rash, arthralgias, visual complaints, headache, numbness weakness or ataxia.      Objective:   Physical Exam  Gen. Pleasant, well-nourished, in no distress ENT - no thrush, no pallor/icterus,no post nasal drip Neck: No JVD, no thyromegaly, no carotid bruits Lungs: no use of accessory muscles, no dullness to percussion, clear without rales or rhonchi  Cardiovascular: Rhythm regular, heart sounds  normal, no murmurs or gallops, no peripheral edema Musculoskeletal: No deformities, no cyanosis or clubbing        Assessment & Plan:   Assessment and Plan Assessment & Plan Pulmonary sarcoidosis Nodules and scarring, particularly in the right lung base. Recent imaging shows interval progression with new nodules and scarring, but no massive fibrosis. Lung function tests indicate restriction with 65% lung expansion, but normal lung lining function at 86%. Symptoms are mild with occasional dyspnea on exertion. Treatment with steroids is considered to reduce inflammation and nodules, with potential side effects including weight gain, hypertension, hyperglycemia, and bone weakening. Short-term treatment is planned to minimize side effects. - Prescribed prednisone  10 mg daily for one month, then reduce to 5 mg daily for one month. - Will repeat chest scan in February to assess treatment response. - Encouraged staying active and weight loss to improve lung function. - Instructed to monitor for side effects of prednisone , including increased appetite, weight gain, and changes in  blood pressure or blood sugar. - Scheduled follow-up appointment after scan to evaluate treatment efficacy and adjust plan as  needed.  HTN - recheck

## 2024-05-04 NOTE — Patient Instructions (Signed)
°  VISIT SUMMARY: You came in today for a follow-up on your lung nodules and shortness of breath. We reviewed your recent chest scan and breathing test results, and discussed your current symptoms and treatment options.  YOUR PLAN: -PULMONARY SARCOIDOSIS: Pulmonary sarcoidosis is a condition where small clusters of inflammatory cells grow in the lungs. Your recent imaging shows new nodules and scarring, but no severe lung damage. Your lung function tests show some restriction, but your lung lining is functioning well. We will start you on prednisone , a steroid, to reduce inflammation and nodules. You will take 10 mg daily for one month, then reduce to 5 mg daily for another month. Be aware of potential side effects like increased appetite, weight gain, and changes in blood pressure or blood sugar. We will repeat your chest scan in February to see how the treatment is working. Staying active and losing weight can help improve your lung function.  INSTRUCTIONS: Please take prednisone  as prescribed: 10 mg daily for one month, then reduce to 5 mg daily for one month. Monitor for any side effects such as increased appetite, weight gain, or changes in blood pressure or blood sugar. We will repeat your chest scan in February to assess your response to the treatment. Schedule a follow-up appointment after your scan to evaluate the effectiveness of the treatment and adjust the plan as needed.                      Contains text generated by [employer].                                 Contains text generated by [employer].

## 2024-07-12 ENCOUNTER — Other Ambulatory Visit

## 2024-08-03 ENCOUNTER — Ambulatory Visit (HOSPITAL_BASED_OUTPATIENT_CLINIC_OR_DEPARTMENT_OTHER): Admitting: Pulmonary Disease
